# Patient Record
Sex: Male | Born: 1935 | Race: White | Hispanic: No | Smoking: Former smoker
Health system: Southern US, Community
[De-identification: ages and names within clinical notes are randomized; demographics above are authoritative.]

## PROBLEM LIST (undated history)

## (undated) DIAGNOSIS — I10 Essential (primary) hypertension: Secondary | ICD-10-CM

## (undated) DIAGNOSIS — IMO0001 Reserved for inherently not codable concepts without codable children: Secondary | ICD-10-CM

## (undated) DIAGNOSIS — Z87442 Personal history of urinary calculi: Secondary | ICD-10-CM

## (undated) DIAGNOSIS — J449 Chronic obstructive pulmonary disease, unspecified: Secondary | ICD-10-CM

## (undated) DIAGNOSIS — E119 Type 2 diabetes mellitus without complications: Secondary | ICD-10-CM

## (undated) DIAGNOSIS — R569 Unspecified convulsions: Secondary | ICD-10-CM

## (undated) DIAGNOSIS — M1990 Unspecified osteoarthritis, unspecified site: Secondary | ICD-10-CM

## (undated) HISTORY — PX: HERNIA REPAIR: SHX51

## (undated) HISTORY — PX: TONSILLECTOMY: SUR1361

## (undated) HISTORY — PX: MELANOMA EXCISION: SHX5266

## (undated) HISTORY — PX: APPENDECTOMY: SHX54

---

## 1999-06-10 ENCOUNTER — Encounter: Admission: RE | Admit: 1999-06-10 | Discharge: 1999-06-10 | Payer: Self-pay | Admitting: Internal Medicine

## 1999-06-17 ENCOUNTER — Encounter: Payer: Self-pay | Admitting: Internal Medicine

## 1999-06-17 ENCOUNTER — Encounter: Admission: RE | Admit: 1999-06-17 | Discharge: 1999-06-17 | Payer: Self-pay | Admitting: Family Medicine

## 2000-08-14 ENCOUNTER — Encounter: Payer: Self-pay | Admitting: Internal Medicine

## 2000-08-14 ENCOUNTER — Encounter: Admission: RE | Admit: 2000-08-14 | Discharge: 2000-08-14 | Payer: Self-pay | Admitting: Internal Medicine

## 2001-11-23 ENCOUNTER — Ambulatory Visit (HOSPITAL_COMMUNITY): Admission: RE | Admit: 2001-11-23 | Discharge: 2001-11-23 | Payer: Self-pay | Admitting: Internal Medicine

## 2001-12-21 ENCOUNTER — Emergency Department (HOSPITAL_COMMUNITY): Admission: EM | Admit: 2001-12-21 | Discharge: 2001-12-21 | Payer: Self-pay | Admitting: Emergency Medicine

## 2001-12-21 ENCOUNTER — Encounter: Payer: Self-pay | Admitting: Emergency Medicine

## 2001-12-28 ENCOUNTER — Emergency Department (HOSPITAL_COMMUNITY): Admission: EM | Admit: 2001-12-28 | Discharge: 2001-12-28 | Payer: Self-pay | Admitting: Emergency Medicine

## 2002-10-07 ENCOUNTER — Ambulatory Visit (HOSPITAL_COMMUNITY): Admission: RE | Admit: 2002-10-07 | Discharge: 2002-10-07 | Payer: Self-pay | Admitting: Gastroenterology

## 2002-10-07 ENCOUNTER — Encounter (INDEPENDENT_AMBULATORY_CARE_PROVIDER_SITE_OTHER): Payer: Self-pay | Admitting: Specialist

## 2006-04-08 ENCOUNTER — Encounter: Admission: RE | Admit: 2006-04-08 | Discharge: 2006-04-08 | Payer: Self-pay | Admitting: Internal Medicine

## 2009-07-14 ENCOUNTER — Emergency Department (HOSPITAL_COMMUNITY): Admission: EM | Admit: 2009-07-14 | Discharge: 2009-07-14 | Payer: Self-pay | Admitting: Emergency Medicine

## 2010-06-24 ENCOUNTER — Ambulatory Visit (HOSPITAL_COMMUNITY)
Admission: RE | Admit: 2010-06-24 | Discharge: 2010-06-24 | Payer: Self-pay | Source: Home / Self Care | Admitting: Urology

## 2010-09-23 ENCOUNTER — Inpatient Hospital Stay (HOSPITAL_COMMUNITY)
Admission: EM | Admit: 2010-09-23 | Discharge: 2010-09-30 | DRG: 596 | Disposition: A | Discharge status: Home / Self Care | Payer: MEDICARE | Attending: Internal Medicine | Admitting: Internal Medicine

## 2010-09-23 DIAGNOSIS — Z7982 Long term (current) use of aspirin: Secondary | ICD-10-CM

## 2010-09-23 DIAGNOSIS — Z79899 Other long term (current) drug therapy: Secondary | ICD-10-CM

## 2010-09-23 DIAGNOSIS — F3289 Other specified depressive episodes: Secondary | ICD-10-CM | POA: Diagnosis present

## 2010-09-23 DIAGNOSIS — B029 Zoster without complications: Principal | ICD-10-CM | POA: Diagnosis present

## 2010-09-23 DIAGNOSIS — F329 Major depressive disorder, single episode, unspecified: Secondary | ICD-10-CM | POA: Diagnosis present

## 2010-09-23 DIAGNOSIS — I10 Essential (primary) hypertension: Secondary | ICD-10-CM | POA: Diagnosis present

## 2010-09-23 DIAGNOSIS — Z860100 Personal history of colon polyps, unspecified: Secondary | ICD-10-CM

## 2010-09-23 DIAGNOSIS — K219 Gastro-esophageal reflux disease without esophagitis: Secondary | ICD-10-CM | POA: Diagnosis present

## 2010-09-23 DIAGNOSIS — J4489 Other specified chronic obstructive pulmonary disease: Secondary | ICD-10-CM | POA: Diagnosis present

## 2010-09-23 DIAGNOSIS — E119 Type 2 diabetes mellitus without complications: Secondary | ICD-10-CM | POA: Diagnosis present

## 2010-09-23 DIAGNOSIS — Z87891 Personal history of nicotine dependence: Secondary | ICD-10-CM

## 2010-09-23 DIAGNOSIS — E785 Hyperlipidemia, unspecified: Secondary | ICD-10-CM | POA: Diagnosis present

## 2010-09-23 DIAGNOSIS — J449 Chronic obstructive pulmonary disease, unspecified: Secondary | ICD-10-CM | POA: Diagnosis present

## 2010-09-23 DIAGNOSIS — K5900 Constipation, unspecified: Secondary | ICD-10-CM | POA: Diagnosis present

## 2010-09-23 LAB — CBC
MCHC: 32.6 g/dL (ref 30.0–36.0)
Platelets: 344 10*3/uL (ref 150–400)
RDW: 14.7 % (ref 11.5–15.5)
WBC: 8.6 10*3/uL (ref 4.0–10.5)

## 2010-09-23 LAB — URINALYSIS, ROUTINE W REFLEX MICROSCOPIC
Bilirubin Urine: NEGATIVE
Nitrite: NEGATIVE
Specific Gravity, Urine: 1.026 (ref 1.005–1.030)
Urine Glucose, Fasting: NEGATIVE mg/dL
pH: 5 (ref 5.0–8.0)

## 2010-09-23 LAB — DIFFERENTIAL
Basophils Absolute: 0 10*3/uL (ref 0.0–0.1)
Basophils Relative: 1 % (ref 0–1)
Eosinophils Absolute: 0.1 10*3/uL (ref 0.0–0.7)
Eosinophils Relative: 1 % (ref 0–5)
Lymphocytes Relative: 25 % (ref 12–46)
Monocytes Absolute: 0.6 10*3/uL (ref 0.1–1.0)

## 2010-09-23 LAB — CK TOTAL AND CKMB (NOT AT ARMC)
CK, MB: 1.4 ng/mL (ref 0.3–4.0)
Total CK: 46 U/L (ref 7–232)

## 2010-09-23 LAB — CARDIAC PANEL(CRET KIN+CKTOT+MB+TROPI): CK, MB: 1.5 ng/mL (ref 0.3–4.0)

## 2010-09-23 LAB — COMPREHENSIVE METABOLIC PANEL
ALT: 13 U/L (ref 0–53)
AST: 15 U/L (ref 0–37)
Albumin: 3.8 g/dL (ref 3.5–5.2)
Alkaline Phosphatase: 45 U/L (ref 39–117)
Calcium: 9.4 mg/dL (ref 8.4–10.5)
GFR calc Af Amer: >60 mL/min (ref >60)
Glucose, Bld: 123 mg/dL — ABNORMAL HIGH (ref 70–99)
Potassium: 4.1 mEq/L (ref 3.5–5.1)
Sodium: 138 mEq/L (ref 135–145)
Total Protein: 6.6 g/dL (ref 6.0–8.3)

## 2010-09-23 LAB — HEMOGLOBIN A1C: Mean Plasma Glucose: 131 mg/dL — ABNORMAL HIGH (ref <117)

## 2010-09-23 LAB — TROPONIN I: Troponin I: <0.01 ng/mL (ref 0.00–0.06)

## 2010-09-23 LAB — MRSA PCR SCREENING: MRSA by PCR: NEGATIVE

## 2010-09-24 LAB — LIPID PANEL
HDL: 32 mg/dL — ABNORMAL LOW (ref >39)
Total CHOL/HDL Ratio: 3.6 RATIO
Triglycerides: 186 mg/dL — ABNORMAL HIGH (ref <150)
VLDL: 37 mg/dL (ref 0–40)

## 2010-09-24 LAB — GLUCOSE, CAPILLARY
Glucose-Capillary: 167 mg/dL — ABNORMAL HIGH (ref 70–99)
Glucose-Capillary: 163 mg/dL — ABNORMAL HIGH (ref 70–99)

## 2010-09-24 LAB — CBC
HCT: 34.9 % — ABNORMAL LOW (ref 39.0–52.0)
MCH: 27.4 pg (ref 26.0–34.0)
MCV: 87 fL (ref 78.0–100.0)
RBC: 4.01 MIL/uL — ABNORMAL LOW (ref 4.22–5.81)
WBC: 9.9 10*3/uL (ref 4.0–10.5)

## 2010-09-24 LAB — BASIC METABOLIC PANEL
CO2: 26 mEq/L (ref 19–32)
Chloride: 105 mEq/L (ref 96–112)
GFR calc Af Amer: >60 mL/min (ref >60)
Potassium: 4 mEq/L (ref 3.5–5.1)

## 2010-09-24 LAB — CARDIAC PANEL(CRET KIN+CKTOT+MB+TROPI): Troponin I: 0.02 ng/mL (ref 0.00–0.06)

## 2010-09-25 LAB — BASIC METABOLIC PANEL
BUN: 16 mg/dL (ref 6–23)
Chloride: 106 mEq/L (ref 96–112)
Creatinine, Ser: 1.06 mg/dL (ref 0.4–1.5)

## 2010-09-25 LAB — GLUCOSE, CAPILLARY: Glucose-Capillary: 144 mg/dL — ABNORMAL HIGH (ref 70–99)

## 2010-09-25 LAB — CBC
MCH: 27.5 pg (ref 26.0–34.0)
MCHC: 31.6 g/dL (ref 30.0–36.0)
MCV: 86.9 fL (ref 78.0–100.0)
Platelets: 341 10*3/uL (ref 150–400)
RBC: 4.44 MIL/uL (ref 4.22–5.81)
RDW: 15.4 % (ref 11.5–15.5)

## 2010-09-26 LAB — GLUCOSE, CAPILLARY
Glucose-Capillary: 131 mg/dL — ABNORMAL HIGH (ref 70–99)
Glucose-Capillary: 119 mg/dL — ABNORMAL HIGH (ref 70–99)

## 2010-09-27 LAB — GLUCOSE, CAPILLARY
Glucose-Capillary: 119 mg/dL — ABNORMAL HIGH (ref 70–99)
Glucose-Capillary: 163 mg/dL — ABNORMAL HIGH (ref 70–99)

## 2010-09-28 LAB — GLUCOSE, CAPILLARY
Glucose-Capillary: 83 mg/dL (ref 70–99)
Glucose-Capillary: 217 mg/dL — ABNORMAL HIGH (ref 70–99)
Glucose-Capillary: 122 mg/dL — ABNORMAL HIGH (ref 70–99)

## 2010-09-30 LAB — GLUCOSE, CAPILLARY: Glucose-Capillary: 99 mg/dL (ref 70–99)

## 2010-10-02 NOTE — Discharge Summary (Signed)
NAMEVISHWA, Alejandro Hall NO.:  [id number]  MEDICAL RECORD NO.:  [id number]           PATIENT TYPE:  I  LOCATION:  4506                         FACILITY:  [facility]  PHYSICIAN:  Thora Lance, M.D.  DATE OF BIRTH:  November 13, 1928  DATE OF ADMISSION:  09/23/2010 DATE OF DISCHARGE:  09/30/2010                              DISCHARGE SUMMARY   REASON FOR ADMISSION:  A 77 year old white male who presented with chest pain and left flank pain.  The pain started 2 days prior to admission. The pain had been severe.  SIGNIFICANT FINDINGS:  VITAL SIGNS:  Blood pressure 132/82, heart rate 69, respirations 20, oxygen saturation 98%. LUNGS:  Decreased breath sounds bilaterally. HEART:  Regular rate and rhythm. ABDOMEN:  Soft, nontender, nondistended. EXTREMITIES:  No edema.  LABORATORY:  CBC; WBC was 8.6, hemoglobin 13.1, hematocrit 40.2, platelets 244,000.  Sodium 130, potassium 4.1, BUN 22, creatinine 1.22. Troponin less 0.01.  Chest x-ray, hyperinflated lungs.  CT scan of the abdomen and pelvis showed nonobstructive tiny bilateral renal calculi, no evidence of urinary obstruction, moderate BPH, and small left adrenal adenoma.  EKG showed some T-wave inversions and ST depression in V5 and V6.  HOSPITAL COURSE:  The patient was admitted for chest pain.  Cardiology was initially consulted.  They felt his pain was not likely to be cardiac as a D-dimer was done and was normal which essentially ruled out PE.  On September 25, 2010, a small area of erythema developed on his left side.  By September 26, 2010, a typical maculovesicular rash of herpes zoster was evident.  The patient was treated with valacyclovir, steroids, Lidoderm patch, narcotics, and Lyrica for his severe pain.  By discharge, his pain was considerably better.  He was treated with laxatives for constipation.  Despite steroids, his diabetes remained under reasonable control on an insulin sliding scale.  At  discharge, oxygen saturation was 94% on room air.  The patient was discharged in good condition.  DISCHARGE DIAGNOSES: 1. Herpes zoster. 2. Chronic obstructive pulmonary disease. 3. Type 2 diabetes. 4. Hypertension. 5. Hyperlipidemia. 6. Gastroesophageal reflux disease. 7. Depression. 8. Kidney stones.  PROCEDURES:  Echocardiogram.  DISCHARGE MEDICATIONS: 1. Lipitor 20 mg one p.o. daily. 2. Aspirin 81 mg one p.o. daily. 3. Amlodipine 10 mg one p.o. daily. 4. Sertraline 50 mg one p.o. daily. 5. Albuterol HFA 108 mcg 2 inhalations q.4 p.r.n. 6. Symbicort 150/4.5 mcg 2 puffs twice a day. 7. Metformin 1000 mg b.i.d. 8. Lorazepam 1 mg at bedtime as needed. 9. Fish oil 1000 mg 1 tablet once a day. 10.Multivitamin 1 p.o. at bedtime. 11.Flonase 2 sprays each nostril daily as needed. 12.Percocet 10/325 one p.o. q.6 p.r.n. for pain. 13.Lidoderm patch, apply to painful area on side daily for 12 hours as     needed for pain. 14.Neurontin 300 mg one p.o. t.i.d. 15.Valacyclovir 1000 mg p.o. t.i.d. for 2 days and then discontinue. 16.Prednisone 20 mg tablets, one a day for 1 day, one-half a day for 2     days, and then discontinue.  DISPOSITION:  Discharged to home.  FOLLOW  UP:  Ten days, Dr. Valentina Lucks.  ACTIVITY:  As tolerated.  CODE STATUS:  Full code.          ______________________________ Thora Lance, M.D.     JJG/MEDQ  D:  09/30/2010  T:  09/30/2010  Job:  [id number]  Electronically Signed by Kirby Funk M.D. on 10/02/2010 05:52:54 PM

## 2010-10-09 NOTE — H&P (Signed)
NAMEJADIEN, LEHIGH                  ACCOUNT NO.:  [id number]  MEDICAL RECORD NO.:  [id number]          PATIENT TYPE:  INP  LOCATION:  1831                         FACILITY:  [facility]  PHYSICIAN:  Thad Ranger, MD       DATE OF BIRTH:  1929/02/25  DATE OF ADMISSION:  09/23/2010 DATE OF DISCHARGE:                             HISTORY & PHYSICAL   PRIMARY CARE PHYSICIAN:  Thora Lance, M.D.  UROLOGIST:  Courtney Paris, M.D.  CHIEF COMPLAINT:  Chest pain and left flank pain for the last 2 days.  HISTORY OF PRESENT ILLNESS:  Mr. Bifulco is a 77 year old male with history of hypertension, hyperlipidemia, diabetes mellitus, COPD, a history of kidney stones presented to the [facility] with chest pain as well as left flank pain.  History was provided by the patient who stated that his chest pain started on Saturday 2 days ago. He stated that at the time his chest pain started, he was looking at a car a dealership to buy.  He started having chest pain, tightness, and squeezing which initially has been 10/10 in intensity at the time of arrival to the emergency room.  After the pain medication, his chest pain has improved to 6/10.  He has chronic shortness of breath secondary to severe COPD and states that ambulating about 20-25 feet may make his chest pain as well as the shortness of breath worse.  He also endorses radiation of the left-sided chest pain to the left shoulder as well as middle two fingers of the left hand with numbness and tingling.  He states that he also has some left flank pain which also started at the same time.  He denies any dysuria or hematuria.  He denies any fevers or chills, any productive cough.  He has chronic cough from his COPD.  He denies being on home oxygen.  He states that he does have a history of kidney stones and had lithotripsy done in October 2011.  Hospitalist service was called for the chest pain rule out.  PAST MEDICAL  HISTORY: 1. COPD. 2. Hypertension. 3. Diabetes mellitus. 4. Hyperlipidemia. 5. History of kidney stones with lithotripsy in October 2011.  SOCIAL HISTORY:  The patient was a former smoker who smoked one pack per day for 25 years, quit 30 years ago.  Does not drink or use any alcohol. He lives at home and ambulates without any assistance.  ALLERGIES:  No known drug allergies.  MEDICATIONS:  The patient is not sure of the medication doses hence will await for the pharmacy for the med reconciliation.  He is on aspirin 325 mg p.o. daily.  Other medications include amlodipine, hydrochlorothiazide, Lipitor, metformin, Symbicort, Zoloft, Ativan, timolol, ProAir, Spiriva.  PHYSICAL EXAMINATION:  VITAL SIGNS:  Blood pressure 132/82, pulse 69, respiratory rate 20, O2 sats 98%. GENERAL:  The patient is alert, awake, and oriented x3, not in acute distress. HEENT:  Anicteric sclerae, pink conjunctiva.  Pupils are equal reactive to light and accommodation.  EOMI. NECK:  Supple.  No lymphadenopathy. CVS:  Regular rate and rhythm. CHEST:  Distant  lung sounds with scattered rhonchi. ABDOMEN:  Soft, nontender, nondistended.  Normal bowel sounds.  Mild left CVAP. NEUROLOGIC:  Alert and oriented x3, not in acute distress.  No focal neurological deficits noted. EXTREMITIES:  No cyanosis, clubbing or edema noted in upper or lower extremities.  LAB DIAGNOSTIC DATA:  CBC, white count 8.6, hemoglobin 13.1, hematocrit 40.2, platelets 344.  CMP, sodium 138, potassium 4.1, BUN 22, creatinine 1.22.  Troponin less than 0.01.  UA negative for any UTI.  RADIOLOGICAL DATA:  Chest x-ray on January 30, lungs appear hyperinflated consistent with obstructive pulmonary disease.  No acute superimposed process.  CT abdomen and pelvis without contrast showed one nonobstructing tiny bilateral renal calculi.  No evidence of urinary tract obstruction on either side to the descending and sigmoid colon, diverticulosis  without evidence of acute diverticulitis.  Moderate prostate gland enlargement particularly the median lobe.  Small left adrenal adenoma.  No acute abnormalities involving the abdomen or pelvis apart from possible constipation.  EKG rate 86 with T-wave inversion and ST depression in V5 and V6 as well as V4.  IMPRESSION AND PLAN:  Mr. Kroh is a 77 year old male with history of hypertension, hyperlipidemia, diabetes mellitus presents with chest pain. 1. Left-sided chest pain, possible angina equivalent given the     patient's high risk factors of hypertension, hyperlipidemia, and     diabetes mellitus.  We will admit to the step-down unit for     nitroglycerin drip.  He does have some EKG changes on the EKG;     however, troponin first set has been negative so far.  We will     continue the patient on aspirin, beta-blocker, nitroglycerin and     obtain fasting lipid panel and serial cardiac enzymes.  If the     cardiac enzymes trend to be positive then the patient will be     started on heparin drip.  I have discussed in detail with [facility]     Cardiology Dr. Lance Muss who will be evaluating the patient     as well for further recommendations.  For now, I have also order 2-     D echocardiogram as the patient has a significant history of COPD     to assess for any pulmonary hypertension or cor pulmonale.     Inpatient stress test versus further cardiac intervention will be     determined by Dr. Eldridge Dace. 2. Left flank pain, question if it has been anginal equivalent from     the chest pain.  The patient had a CT abdomen and pelvis done in     the emergency room which did not show any acute obstructing     nephrolithiasis or any acute abdominal abnormalities except     constipation.  We will start the patient on bowel regimen as well. 3. Chronic obstructive pulmonary disease.  The patient would ideally     need PFTs; however, given the description of his shortness of     breath  with minimal exertion, we will continue Symbicort, Spiriva,     and albuterol nebs.  I will also wait echocardiogram to assess for     any pulmonary hypertension.  He will likely benefit from     Pulmonology consult and home O2 evaluation at the time of     discharge. 4. Diabetes mellitus.  Place the patient on sliding scale insulin     while inpatient.  Obtain HbA1c. 5. Hypertension and hyperlipidemia.  Continue antihypertensives and  obtain fasting lipid panel. 6. Prophylaxis.  Lovenox for deep venous thrombosis prophylaxis. 7. Constipation, placed on a bowel regimen. 8. Dr. Kirby Funk will be following this patient as of September 24, 2010.     Thad Ranger, MD     RR/MEDQ  D:  09/23/2010  T:  09/23/2010  Job:  [id number]  cc:   Thora Lance, M.D. Corky Crafts, MD  Electronically Signed by Andres Labrum Dartagnan Beavers  on 10/09/2010 03:10:42 PM

## 2010-10-09 NOTE — Consult Note (Signed)
NAMEENDER, RORKE NO.:  [id number]  MEDICAL RECORD NO.:  [id number]           PATIENT TYPE:  I  LOCATION:  4506                         FACILITY:  [facility]  PHYSICIAN:  Corky Crafts, MDDATE OF BIRTH:  1929/03/20  DATE OF CONSULTATION:  09/23/2010 DATE OF DISCHARGE:                                CONSULTATION   REFERRING PHYSICIAN:  Thad Ranger, MD  PRIMARY CARE PHYSICIAN:  Thora Lance, MD  REASON FOR CONSULTATION:  Chest discomfort.  HISTORY OF PRESENT ILLNESS:  The patient is a 77 year old man, who has had longstanding diabetes, who was admitted for left-sided chest pain. Pain started a couple of days ago and is really in the lower part of his left chest and in his left side of his abdomen and radiates to his left flank.  At times, there is a pain across the entire upper area of his abdomen.  There is nothing in the substernal area.  There is no pain in his upper chest.  The pain is best described as being in his upper abdomen just under the inferior border of his left side of his rib cage. It is essentially constant pain.  It is worse when he takes a deep breath in or if he moves in a certain way.  He has not noticed any relation to walking.  There has been no associated nausea, vomiting, diarrhea, or constipation.  There has been no syncope.  His appetite has been normal.  He has had kidney stones in the past, but this feels somewhat different  PAST MEDICAL HISTORY: 1. COPD. 2. Type 2 diabetes. 3. Hypertension. 4. High cholesterol. 5. GERD. 6. Depression. 7. History of stricture of the esophagus. 8. Kidney stones. 9. Colonic polyps.  ALLERGIES:  No known drug allergies.  SOCIAL HISTORY:  He smoked in the past.  He does not drink alcohol.  He does on get much exercise.  He is retired.  PAST SURGICAL HISTORY: 1. Appendectomy. 2. Tonsillectomy. 3. Bilateral knee surgery. 4. Skin surgery. 5. Umbilical hernia repair. 6. Eye  surgery.  HOME MEDICATIONS:  Symbicort, albuterol, sertraline, lorazepam, amlodipine 10 mg daily, metformin, aspirin 81 mg daily, Tylenol, Flonase, multivitamin, fish oil, Lipitor 20 mg daily.  REVIEW OF SYSTEMS:  Significant for the abdominal and flank pain as described above.  No chest pain.  No leg swelling.  No focal weakness. No bleeding problems.  Unless mentioned above, all other systems negative.  PHYSICAL EXAMINATION:  VITAL SIGNS:  Blood pressure 144/82, heart rate 80. GENERAL:  He is awake, alert, in no apparent distress. HEENT:  Head:  Normocephalic, atraumatic.  Eyes:  Extraocular movements intact. NECK:  No JVD. CARDIOVASCULAR:  Regular rate and rhythm, S1 and S2. LUNGS:  Clear to auscultation bilaterally. ABDOMEN:  Mildly obese, tender to palpation on the left upper quadrant. No rebound.  No guarding. EXTREMITIES:  No edema. NEUROLOGIC:  No focal weakness. SKIN:  No rash. BACK:  No kyphosis.  LABORATORY WORK:  Normal troponin at less than 0.01, creatinine 1.21, potassium 4.1.  Hemoglobin 13.1, white count 8.6.Chest x-ray shows changes consistent with  COPD, no pneumonia or edema. EKG shows normal sinus rhythm, minimal ST depression in the lateral leads, consistent with the same pattern that he had on prior ECG.  ASSESSMENT AND PLAN:  This is a 77 year old with hypertension and atypical chest pain.  Plan: 1. CARDIAC:  We will watch on telemetry, rule out for MI.  He is     currently on a nitroglycerin drip, this does not seem to be helping     much.  My sense is that his pain is not cardiac in nature given the     location.  He did have an abdominal CT which did not show any     significant cause of the pain.  Consider a stress test tomorrow     although again I think this is unlikely to be cardiac.  Certainly     if his cardiac markers became positive, we will have to consider     cardiac catheterization.  He did have atherosclerosis noted in his     abdominal  aorta on the CT scan. 2. Hypertension.  Currently fairly well controlled.  Home meds can be     restarted 3. Of note, he had a negative cardiac cath in 1993 and a negative     Cardiolite in 2003.  No significant cardiac workup since that time. 4. I would not use heparin in this patient at this time unless his     cardiac markers became positive.  We will follow along.     Corky Crafts, MD     JSV/MEDQ  D:  09/25/2010  T:  09/26/2010  Job:  [id number]  Electronically Signed by Lance Muss MD on 10/09/2010 09:48:00 AM

## 2010-11-06 LAB — BASIC METABOLIC PANEL
CO2: 29 mEq/L (ref 19–32)
Chloride: 105 mEq/L (ref 96–112)
GFR calc Af Amer: >60 mL/min (ref >60)
Potassium: 4.1 mEq/L (ref 3.5–5.1)
Sodium: 141 mEq/L (ref 135–145)

## 2010-11-06 LAB — CBC
HCT: 42.9 % (ref 39.0–52.0)
Hemoglobin: 14.2 g/dL (ref 13.0–17.0)
RBC: 4.98 MIL/uL (ref 4.22–5.81)

## 2010-12-07 ENCOUNTER — Emergency Department (HOSPITAL_COMMUNITY): Payer: Medicare Other

## 2010-12-07 ENCOUNTER — Inpatient Hospital Stay (HOSPITAL_COMMUNITY)
Admission: EM | Admit: 2010-12-07 | Discharge: 2010-12-11 | DRG: 316 | Disposition: A | Discharge status: Home / Self Care | Payer: Medicare Other | Attending: Internal Medicine | Admitting: Internal Medicine

## 2010-12-07 DIAGNOSIS — Z9981 Dependence on supplemental oxygen: Secondary | ICD-10-CM

## 2010-12-07 DIAGNOSIS — F3289 Other specified depressive episodes: Secondary | ICD-10-CM | POA: Diagnosis present

## 2010-12-07 DIAGNOSIS — I319 Disease of pericardium, unspecified: Principal | ICD-10-CM | POA: Diagnosis present

## 2010-12-07 DIAGNOSIS — R079 Chest pain, unspecified: Secondary | ICD-10-CM

## 2010-12-07 DIAGNOSIS — E875 Hyperkalemia: Secondary | ICD-10-CM | POA: Diagnosis present

## 2010-12-07 DIAGNOSIS — K219 Gastro-esophageal reflux disease without esophagitis: Secondary | ICD-10-CM | POA: Diagnosis present

## 2010-12-07 DIAGNOSIS — J4489 Other specified chronic obstructive pulmonary disease: Secondary | ICD-10-CM | POA: Diagnosis present

## 2010-12-07 DIAGNOSIS — E119 Type 2 diabetes mellitus without complications: Secondary | ICD-10-CM | POA: Diagnosis present

## 2010-12-07 DIAGNOSIS — I10 Essential (primary) hypertension: Secondary | ICD-10-CM | POA: Diagnosis present

## 2010-12-07 DIAGNOSIS — J449 Chronic obstructive pulmonary disease, unspecified: Secondary | ICD-10-CM | POA: Diagnosis present

## 2010-12-07 DIAGNOSIS — IMO0002 Reserved for concepts with insufficient information to code with codable children: Secondary | ICD-10-CM | POA: Diagnosis present

## 2010-12-07 DIAGNOSIS — F329 Major depressive disorder, single episode, unspecified: Secondary | ICD-10-CM | POA: Diagnosis present

## 2010-12-07 LAB — PROTIME-INR
INR: 1.07 (ref 0.00–1.49)
Prothrombin Time: 14.1 seconds (ref 11.6–15.2)

## 2010-12-07 LAB — DIFFERENTIAL
Basophils Absolute: 0 K/uL (ref 0.0–0.1)
Basophils Relative: 0 % (ref 0–1)
Eosinophils Absolute: 0 K/uL (ref 0.0–0.7)
Eosinophils Relative: 0 % (ref 0–5)
Lymphocytes Relative: 12 % (ref 12–46)
Lymphs Abs: 1.7 10*3/uL (ref 0.7–4.0)
Monocytes Absolute: 1.8 K/uL — ABNORMAL HIGH (ref 0.1–1.0)
Monocytes Relative: 13 % — ABNORMAL HIGH (ref 3–12)
Neutro Abs: 11 10*3/uL — ABNORMAL HIGH (ref 1.7–7.7)
Neutrophils Relative %: 75 % (ref 43–77)

## 2010-12-07 LAB — CK TOTAL AND CKMB (NOT AT ARMC)
CK, MB: 1.3 ng/mL (ref 0.3–4.0)
Relative Index: INVALID (ref 0.0–2.5)
Relative Index: INVALID (ref 0.0–2.5)
Total CK: 72 U/L (ref 7–232)
Total CK: 45 U/L (ref 7–232)

## 2010-12-07 LAB — URINALYSIS, ROUTINE W REFLEX MICROSCOPIC
Bilirubin Urine: NEGATIVE
Glucose, UA: NEGATIVE mg/dL
Hgb urine dipstick: NEGATIVE
Ketones, ur: NEGATIVE mg/dL
Nitrite: NEGATIVE
Protein, ur: NEGATIVE mg/dL
Specific Gravity, Urine: 1.016 (ref 1.005–1.030)
Urobilinogen, UA: 0.2 mg/dL (ref 0.0–1.0)
pH: 5.5 (ref 5.0–8.0)

## 2010-12-07 LAB — BASIC METABOLIC PANEL
CO2: 28 mEq/L (ref 19–32)
CO2: 27 mEq/L (ref 19–32)
Calcium: 9.8 mg/dL (ref 8.4–10.5)
Chloride: 103 mEq/L (ref 96–112)
Creatinine, Ser: 1.2 mg/dL (ref 0.4–1.5)
Creatinine, Ser: 1.32 mg/dL (ref 0.4–1.5)
GFR calc Af Amer: >60 mL/min (ref >60)
GFR calc Af Amer: >60 mL/min (ref >60)
GFR calc non Af Amer: 58 mL/min — ABNORMAL LOW (ref >60)

## 2010-12-07 LAB — GLUCOSE, CAPILLARY
Glucose-Capillary: 151 mg/dL — ABNORMAL HIGH (ref 70–99)
Glucose-Capillary: 87 mg/dL (ref 70–99)

## 2010-12-07 LAB — BASIC METABOLIC PANEL WITH GFR
BUN: 16 mg/dL (ref 6–23)
Chloride: 100 meq/L (ref 96–112)
Glucose, Bld: 163 mg/dL — ABNORMAL HIGH (ref 70–99)
Potassium: 5.3 meq/L — ABNORMAL HIGH (ref 3.5–5.1)
Sodium: 136 meq/L (ref 135–145)

## 2010-12-07 LAB — CBC
HCT: 40.2 % (ref 39.0–52.0)
Hemoglobin: 13.3 g/dL (ref 13.0–17.0)
MCH: 28.2 pg (ref 26.0–34.0)
MCHC: 33.1 g/dL (ref 30.0–36.0)
MCV: 85.4 fL (ref 78.0–100.0)
Platelets: 372 K/uL (ref 150–400)
RBC: 4.71 MIL/uL (ref 4.22–5.81)
RDW: 15.2 % (ref 11.5–15.5)
WBC: 14.6 10*3/uL — ABNORMAL HIGH (ref 4.0–10.5)

## 2010-12-07 LAB — TROPONIN I
Troponin I: 0.02 ng/mL (ref 0.00–0.06)
Troponin I: 0.04 ng/mL (ref 0.00–0.06)

## 2010-12-07 LAB — D-DIMER, QUANTITATIVE: D-Dimer, Quant: 0.39 ug/mL-FEU (ref 0.00–0.48)

## 2010-12-07 LAB — APTT: aPTT: 34 seconds (ref 24–37)

## 2010-12-07 LAB — CARDIAC PANEL(CRET KIN+CKTOT+MB+TROPI): Troponin I: 0.03 ng/mL (ref 0.00–0.06)

## 2010-12-07 LAB — TSH: TSH: 1.9 u[IU]/mL (ref 0.350–4.500)

## 2010-12-08 DIAGNOSIS — I10 Essential (primary) hypertension: Secondary | ICD-10-CM

## 2010-12-08 DIAGNOSIS — E119 Type 2 diabetes mellitus without complications: Secondary | ICD-10-CM

## 2010-12-08 DIAGNOSIS — I309 Acute pericarditis, unspecified: Secondary | ICD-10-CM

## 2010-12-08 DIAGNOSIS — J449 Chronic obstructive pulmonary disease, unspecified: Secondary | ICD-10-CM

## 2010-12-08 LAB — CARDIAC PANEL(CRET KIN+CKTOT+MB+TROPI)
CK, MB: 1.5 ng/mL (ref 0.3–4.0)
Relative Index: INVALID (ref 0.0–2.5)
Troponin I: 0.03 ng/mL (ref 0.00–0.06)

## 2010-12-08 LAB — GLUCOSE, CAPILLARY
Glucose-Capillary: 99 mg/dL (ref 70–99)
Glucose-Capillary: 130 mg/dL — ABNORMAL HIGH (ref 70–99)

## 2010-12-08 LAB — CBC
Hemoglobin: 11.4 g/dL — ABNORMAL LOW (ref 13.0–17.0)
RBC: 4.15 MIL/uL — ABNORMAL LOW (ref 4.22–5.81)
WBC: 9.7 10*3/uL (ref 4.0–10.5)

## 2010-12-08 LAB — LIPID PANEL
Cholesterol: 101 mg/dL (ref 0–200)
Total CHOL/HDL Ratio: 2.5 RATIO

## 2010-12-08 LAB — BASIC METABOLIC PANEL
Chloride: 103 mEq/L (ref 96–112)
GFR calc Af Amer: 59 mL/min — ABNORMAL LOW (ref >60)
Potassium: 4.1 mEq/L (ref 3.5–5.1)

## 2010-12-08 LAB — SEDIMENTATION RATE: Sed Rate: 42 mm/hr — ABNORMAL HIGH (ref 0–16)

## 2010-12-09 LAB — GLUCOSE, CAPILLARY
Glucose-Capillary: 114 mg/dL — ABNORMAL HIGH (ref 70–99)
Glucose-Capillary: 134 mg/dL — ABNORMAL HIGH (ref 70–99)
Glucose-Capillary: 99 mg/dL (ref 70–99)

## 2010-12-10 LAB — BASIC METABOLIC PANEL
Calcium: 8.2 mg/dL — ABNORMAL LOW (ref 8.4–10.5)
GFR calc Af Amer: >60 mL/min (ref >60)
GFR calc non Af Amer: >60 mL/min (ref >60)
Sodium: 143 mEq/L (ref 135–145)

## 2010-12-10 LAB — CBC
Hemoglobin: 10.5 g/dL — ABNORMAL LOW (ref 13.0–17.0)
MCHC: 32 g/dL (ref 30.0–36.0)
Platelets: 330 10*3/uL (ref 150–400)
RDW: 15.7 % — ABNORMAL HIGH (ref 11.5–15.5)

## 2010-12-10 LAB — GLUCOSE, CAPILLARY
Glucose-Capillary: 99 mg/dL (ref 70–99)
Glucose-Capillary: 96 mg/dL (ref 70–99)
Glucose-Capillary: 102 mg/dL — ABNORMAL HIGH (ref 70–99)

## 2010-12-11 LAB — CBC
MCH: 27.4 pg (ref 26.0–34.0)
MCV: 85.1 fL (ref 78.0–100.0)
Platelets: 345 10*3/uL (ref 150–400)
RDW: 15.4 % (ref 11.5–15.5)

## 2010-12-11 LAB — BASIC METABOLIC PANEL
BUN: 22 mg/dL (ref 6–23)
Creatinine, Ser: 0.97 mg/dL (ref 0.4–1.5)
GFR calc non Af Amer: >60 mL/min (ref >60)
Glucose, Bld: 104 mg/dL — ABNORMAL HIGH (ref 70–99)

## 2010-12-11 LAB — GLUCOSE, CAPILLARY: Glucose-Capillary: 105 mg/dL — ABNORMAL HIGH (ref 70–99)

## 2010-12-12 NOTE — Consult Note (Signed)
NAMESHANDELL, JALLOW                  ACCOUNT NO.:  [id number]  MEDICAL RECORD NO.:  [id number]           PATIENT TYPE:  I  LOCATION:  3708                         FACILITY:  [facility]  PHYSICIAN:  Peter C. Eden Emms, MD, FACCDATE OF BIRTH:  [birth year]  DATE OF CONSULTATION:  12/07/2010 DATE OF DISCHARGE:                                CONSULTATION   A 77 year old patient we are asked to see by the teaching service for recurrent chest pain.  The patient had just been evaluated by Dr. Catalina Gravel on February 2.  He was hospitalized for what was thought to be atypical chest pain.  He ruled out for myocardial infarction.  His EKG had no acute changes.  He had a 2-D echocardiogram done on January 31 which showed mild concentric left ventricular hypertrophy with no pericardial effusion and no significant valvular heart disease.  He has had recurrent chest pain since Thursday.  During his last hospitalization, he had a skin eruption which turned out to be shingles. This has been somewhat painful.  Since Thursday, he has had pain in the left side of his chest and shoulder area.  It is positional.  There is also a pleuritic component.  His EKG in the emergency room is consistent with pericarditis with PR depression in II, III, and F and slight ST elevation in the inferolateral leads.  His cardiac enzymes are negative and his clinical history is much more consistent with a non acute coronary syndrome and likely pericardial inflammation.  He has chronic dyspnea and is on home O2 even though he quit smoking more than 30 years ago.  He denies nausea or vomiting.  He denies recent trauma.  He denies any change in his chronic dyspnea.  He has had significant pain from his shingles which continues.  He is being admitted by the teaching service for pain control.  His 10-point review of systems otherwise negative.  PAST MEDICAL HISTORY:  Remarkable for: 1. Hypertension. 2. Diabetes. 3.  Hyperlipidemia. 4. Kidney stones. 5. COPD. 6. Recent hospitalization for chest pain with shingles.  The patient is a former smoker.  He is widowed.  He lives with his son. He does not drink.  He ambulates with a cane and is on home O2 every night and most days.  He has no known allergies.  MEDICATIONS: 1. Amlodipine 10 mg a day. 2. Symbicort. 3. Tylox. 4. Lorazepam. 5. Lipitor 20 mg a day. 6. Hydrochlorothiazide 12.5 a day. 7. Cymbalta 60 mg a day. 8. Sertraline 50 mg a day. 9. Metformin 1 g b.i.d. 10.Lyrica 150 b.i.d.  He has no history of bleeding ulcers or gastritis.  No history of lower GI bleed or melena.  FAMILY HISTORY:  Negative for premature coronary artery disease and otherwise noncontributory.  PHYSICAL EXAMINATION:  GENERAL:  Remarkable for chronically ill- appearing male with oxygen on. VITAL SIGNS:  His vital signs are stable.  Blood pressure is 130/80, pulse is 80 and regular, respiratory rate 14, and afebrile. HEENT:  Unremarkable. NECK:  There is no bruit.  No JVP elevation.  No lymphadenopathy. LUNGS:  Inspiratory rhonchi  which appear chronic.  He has reasonable diaphragmatic motion. CARDIAC:  There is an S1 and S2.  I cannot hear a rub, but it is somewhat noisy in the emergency room.  There is no obvious murmur. CHEST:  He has shingles over the chest wall. ABDOMEN:  Benign.  Bowel sounds positive.  No AAA.  No tenderness.  No bruit.  No hepatosplenomegaly, hepatojugular reflux, or tenderness. EXTREMITIES:  Distal pulses are intact with trace edema. NEUROLOGIC:  Nonfocal. SKIN:  Warm and dry. MUSCULOSKELETAL:  No muscular weakness.  His chest x-ray shows emphysema with no active disease.  His point-of- care markers are negative for acute ischemic syndrome.  Potassium is 5.3, BUN is 16, creatinine is 1.2.  D-dimer is negative.  White count is elevated at 14.6 and hematocrit is 40.2.  IMPRESSION: 1. Chest pain with positional and pleuritic  component.  ECG suggesting     pericardial involvement.  No evidence of acute coronary syndrome.     We will treat the patient with b.i.d. aspirin, q.8 Naprosyn, and     b.i.d. colchicine.  We would avoid steroids in the setting of     recent shingles.  We will do a followup echocardiogram to make sure     he has not developed a significant pericardial effusion.  The     patient does have an elevated white count and I will leave it up to     the primary service to rule out any other infectious source of his     symptoms.  I suspect that the pain is also being contributed to by     his shingles. 2. Diabetes.  Continue metformin.  Hemoglobin A1c per primary service. 3. Hypertension, currently well controlled.  Continue Norvasc.  We will add a sedimentation rate and continue to get cardiac enzymes on the patient, although I believe his EKG and clinical symptoms are consistent with pericardial inflammation.     Noralyn Pick. Eden Emms, MD, [facility]     PCN/MEDQ  D:  12/07/2010  T:  12/08/2010  Job:  [id number]  cc:   Thora Lance, M.D. Corky Crafts, MD  Electronically Signed by Charlton Haws MD [facility] on 12/12/2010 11:32:28 AM

## 2010-12-16 NOTE — H&P (Signed)
NAMESOPHIA, Hall NO.:  [id number]  MEDICAL RECORD NO.:  [id number]           PATIENT TYPE:  E  LOCATION:  [facility]                         FACILITY:  [facility]  PHYSICIAN:  Zannie Cove, MD     DATE OF BIRTH:  1929/04/18  DATE OF ADMISSION:  12/07/2010 DATE OF DISCHARGE:                             HISTORY & PHYSICAL   PRIMARY CARE PHYSICIAN:  Thora Lance, MD.  CHIEF COMPLAINT:  Chest and upper back pain.  HISTORY OF PRESENT ILLNESS:  Mr. Alejandro Hall was a 77 year old pleasant Caucasian gentleman with history of COPD, diabetes, hypertension, presents to the ER with the above-mentioned complaints.  The patient reports that since Thursday, i.e., since the last two days, he has been having severe dull ache across his chest as well as between his shoulder blades.  It is described as a severe ache occasionally radiating down the left arm.  Not associated with nausea or diaphoresis.  Associated with some shortness of breath.  In addition, the chest pain is pleuritic in nature and increases severely with deep inspiration.  Upon evaluation in the ER, he was found to have PR depression on EKG as well as mild diffuse ST-elevation diffusely and triad hospitalist have requested to admit him for the same.  Of note, the patient does report history of cough, congestion, and URI-like symptoms four days ago, which has since improved.  PAST MEDICAL HISTORY:  Significant for, 1. COPD, on home O2. 2. History of herpes zoster. 3. Type 2 diabetes. 4. Hypertension. 5. Dyslipidemia. 6. GERD. 7. Depression. 8. History of kidney stones. 9. History of negative cardiac cath in 1993 and negative Cardiolite in     2003.  MEDICATIONS: 1. Lorazepam 1 mg p.o. at bedtime p.r.n. 2. Lipitor 20 mg p.o. daily. 3. Metformin 1000 mg p.o. b.i.d. 4. Sertraline 50 mg p.o. daily. 5. Amlodipine 10 mg daily. 6. Symbicort 150/4.5 mcg 1 puff b.i.d. 7. Fish oil once a day. 8. ProAir as  needed. 9. Cymbalta and Lyrica doses yet to be confirmed.  ALLERGIES:  No known drug allergies.  SOCIAL HISTORY:  He lives at home and ambulates without assistance. Former smoker, quit over 30 years ago.  No history of alcohol use.  FAMILY HISTORY:  Due to advanced age is noncontributory.  REVIEW OF SYSTEMS:  Negative except per HPI.  PHYSICAL EXAMINATION:  VITAL SIGNS:  Temperature is 98, pulse 104, blood pressure 160/83, respirations 22, satting 95% on 2 L. GENERAL:  He is averagely built Caucasian gentleman, lying in the stretcher, in no acute distress. HEENT:  Pupils round and reactive to light.  Extraocular movements intact. NECK:  No JVD or lymphadenopathy. CARDIOVASCULAR:  S1 and S2, regular rate and rhythm.  I am not able to appreciate any murmurs. LUNGS:  Clear to auscultation bilaterally. ABDOMEN:  There is slight tenderness in the epigastric area, but otherwise no rigidity, rebound, and normal bowel sounds. EXTREMITIES:  No edema, clubbing, or cyanosis. NEURO:  Moves all extremities and grossly nonfocal.  LABORATORY DATA:  White count of 14.6 with monocytosis, hemoglobin 13.3, platelets of 372.  Chemistries, coags  normal, D-dimer is 0.39.  BMET: Sodium 136, potassium 4.3, chloride 100, bicarb 28, BUN 68, creatinine 1.2, calcium 9.8.  Cardiac markers x1 negative.  UA normal. Chest x-ray, no acute cardiopulmonary disease.  ASSESSMENT/PLAN: 1. Alejandro Hall is a 77 year old gentleman with severe pleuritic chest     pain, suspect pericarditis as the etiology for this.  However, due     to his multiple risk factors also admitted to tele to rule out ACS.     We will repeat an EKG in the morning and check a 2-D echo to     confirm the diagnosis.  We will also check TSH level.  Start an     NSAID trial 600 mg three times a day with meals along with the PPI.     If no improvement, could try colchicine.  I have consulted Dr.     Donnie Aho covering for [facility]. 2. Chronic  obstructive pulmonary disease, on home O2, stable.     Continue with Symbicort and albuterol p.r.n. 3. Diabetes.  We will put him on sliding scale insulin.  Hold his     metformin for now. 4. Hypertension.  Continue home medications. 5. Hyperkalemia.  We will hydrate with normal saline.  We will give     him low-dose Kayexalate and repeat BMET this afternoon. 6. Further management as condition evolves.     Zannie Cove, MD     PJ/MEDQ  D:  12/07/2010  T:  12/07/2010  Job:  [id number]  cc:   Thora Lance, M.D.  Electronically Signed by Zannie Cove  on 12/16/2010 08:29:11 PM

## 2010-12-19 NOTE — Discharge Summary (Signed)
Alejandro Hall, Alejandro Hall NO.:  [id number]  MEDICAL RECORD NO.:  [id number]           PATIENT TYPE:  I  LOCATION:  3708                         FACILITY:  [facility]  PHYSICIAN:  Thora Lance, M.D.  DATE OF BIRTH:  1928-10-04  DATE OF ADMISSION:  12/07/2010 DATE OF DISCHARGE:  12/11/2010                              DISCHARGE SUMMARY   REASON FOR ADMISSION:  A 77 year old white male with history of COPD, diabetes mellitus, hypertension, and posttraumatic neuralgia who presented with chest pain of 2 days duration.  It is described as ache across his chest as well as between his shoulder blades.  The pain is pleuritic, worse with deep inspiration and also lying down.  EKG in the ER showed PR depressions as well as mild diffuse ST elevation suggesting possible pericarditis.  SIGNIFICANT FINDINGS:  VITAL SIGNS:  Temperature 98, heart rate 104, blood pressure 160/83, and respirations 22. LUNGS:  Clear. HEART:  Regular rate and rhythm without murmur, gallop, or rub. ABDOMEN:  Soft and nontender.  Normal bowel sounds.  No mass. EXTREMITIES:  No edema.  LABORATORY DATA:  WBC 14.6, hemoglobin 13.3, and platelet count 372. Chemistries:  Sodium 136, potassium 4.3, chloride, bicarbonate 28, BUN 68, creatinine 1.2, and calcium 9.8.  Urinalysis normal.  Chest x-ray showed no acute cardiopulmonary disease.  D-dimer was 0.39.  HOSPITAL COURSE:  The patient was admitted to rule out acute coronary syndrome.  His cardiac enzymes were negative.  He was seen by Dr. Tresa Endo of Cardiology Service.  It was Dr. York Spaniel assessment that there was no evidence of acute coronary syndrome.  He felt that the chest pain was consistent with pericarditis and he was placed on aspirin, Naprosyn, and colchicine.  PLAN:  An echocardiogram was done on December 08, 2010, and showed normal left ventricular function, normal cavity size, mild dilation of left atrium and right atrium, pulmonary artery peak  pressure 42 and no evidence of pericardial fluid.  The patient's chest pain resolved by discharge with antiinflammatory treatment.  Aspirin and colchicine were stopped and will be continued on 4 more days of Naprosyn with GI prophylaxis with a PPI.  The patient's creatinine did bump up to 1.4, but at discharge it was down to 0.9.  IV fluids were discontinued.  The patient's COPD was stable and diabetes was well controlled during this hospitalization.  DISCHARGE DIAGNOSES: 1. Acute pericarditis. 2. Chronic obstructive pulmonary disease. 3. Diabetes mellitus, type 2. 4. Recent herpes zoster with postherpetic neuralgia. 5. Hypertension. 6. Dyslipidemia. 7. Gastroesophageal reflux disease. 8. Depression. 9. History of kidney stones.  PROCEDURE:  Two-D echocardiogram.  DISCHARGE MEDICATIONS: 1. Naprosyn 500 mg 1 twice a day. 2. Omeprazole 20 mg 1 a day. 3. Amlodipine 10 mg 1 a day. 4. Cymbalta 60 mg 1 a day. 5. Fish oil 2 capsules by mouth daily. 6. Hydrochlorothiazide 12.5 mg 1 tablet once a day. 7. Lipitor 20 mg 1 a day. 8. Lorazepam 1 mg 1 tablet at bedtime as needed. 9. Lyrica 150 mg 1 tablet twice a day. 10.Metformin 1000 mg 1 tablet twice a day. 11.Symbicort 165/4.5  mcg 2 puffs inhaled twice a day. 12.Senokot 2 tablets by mouth at bedtime.  DISPOSITION:  Discharged to home.  CODE STATUS:  Full code.  Oxygen 2 liters nasal cannula.  DIET:  Low-sodium, diabetic diet.  ACTIVITY:  As tolerated.  FOLLOWUP:  2 weeks Dr. Valentina Lucks.          ______________________________ Thora Lance, M.D.     JJG/MEDQ  D:  12/11/2010  T:  12/11/2010  Job:  [id number]  Electronically Signed by Kirby Funk M.D. on 12/19/2010 08:24:35 AM

## 2014-08-12 ENCOUNTER — Encounter (HOSPITAL_COMMUNITY): Payer: Self-pay

## 2014-08-12 ENCOUNTER — Emergency Department (HOSPITAL_COMMUNITY): Payer: Medicare Other

## 2014-08-12 ENCOUNTER — Inpatient Hospital Stay (HOSPITAL_COMMUNITY)
Admission: EM | Admit: 2014-08-12 | Discharge: 2014-08-17 | DRG: 190 | Disposition: A | Discharge status: Home Health | Payer: Medicare Other | Attending: Family Medicine | Admitting: Family Medicine

## 2014-08-12 DIAGNOSIS — J441 Chronic obstructive pulmonary disease with (acute) exacerbation: Secondary | ICD-10-CM | POA: Diagnosis present

## 2014-08-12 DIAGNOSIS — I272 Other secondary pulmonary hypertension: Secondary | ICD-10-CM | POA: Diagnosis present

## 2014-08-12 DIAGNOSIS — R0602 Shortness of breath: Secondary | ICD-10-CM | POA: Diagnosis present

## 2014-08-12 DIAGNOSIS — E119 Type 2 diabetes mellitus without complications: Secondary | ICD-10-CM

## 2014-08-12 DIAGNOSIS — Z7982 Long term (current) use of aspirin: Secondary | ICD-10-CM | POA: Diagnosis not present

## 2014-08-12 DIAGNOSIS — Z79899 Other long term (current) drug therapy: Secondary | ICD-10-CM | POA: Diagnosis not present

## 2014-08-12 DIAGNOSIS — Z794 Long term (current) use of insulin: Secondary | ICD-10-CM

## 2014-08-12 DIAGNOSIS — Z9981 Dependence on supplemental oxygen: Secondary | ICD-10-CM

## 2014-08-12 DIAGNOSIS — I10 Essential (primary) hypertension: Secondary | ICD-10-CM | POA: Diagnosis present

## 2014-08-12 DIAGNOSIS — Z87891 Personal history of nicotine dependence: Secondary | ICD-10-CM

## 2014-08-12 DIAGNOSIS — G4733 Obstructive sleep apnea (adult) (pediatric): Secondary | ICD-10-CM | POA: Diagnosis present

## 2014-08-12 DIAGNOSIS — J9601 Acute respiratory failure with hypoxia: Secondary | ICD-10-CM | POA: Diagnosis present

## 2014-08-12 DIAGNOSIS — Z7952 Long term (current) use of systemic steroids: Secondary | ICD-10-CM

## 2014-08-12 DIAGNOSIS — J9801 Acute bronchospasm: Secondary | ICD-10-CM | POA: Diagnosis not present

## 2014-08-12 HISTORY — DX: Unspecified convulsions: R56.9

## 2014-08-12 HISTORY — DX: Chronic obstructive pulmonary disease, unspecified: J44.9

## 2014-08-12 LAB — CBC
HCT: 42.7 % (ref 39.0–52.0)
HEMATOCRIT: 45.6 % (ref 39.0–52.0)
HEMOGLOBIN: 13.9 g/dL (ref 13.0–17.0)
Hemoglobin: 15.1 g/dL (ref 13.0–17.0)
MCH: 29 pg (ref 26.0–34.0)
MCH: 27.9 pg (ref 26.0–34.0)
MCHC: 33.1 g/dL (ref 30.0–36.0)
MCHC: 32.6 g/dL (ref 30.0–36.0)
MCV: 87.5 fL (ref 78.0–100.0)
MCV: 85.6 fL (ref 78.0–100.0)
PLATELETS: 257 10*3/uL (ref 150–400)
PLATELETS: 252 10*3/uL (ref 150–400)
RBC: 5.21 MIL/uL (ref 4.22–5.81)
RBC: 4.99 MIL/uL (ref 4.22–5.81)
RDW: 14.8 % (ref 11.5–15.5)
RDW: 14.9 % (ref 11.5–15.5)
WBC: 12.8 10*3/uL — ABNORMAL HIGH (ref 4.0–10.5)
WBC: 6.8 10*3/uL (ref 4.0–10.5)

## 2014-08-12 LAB — BASIC METABOLIC PANEL
ANION GAP: 17 — AB (ref 5–15)
BUN: 12 mg/dL (ref 6–23)
CALCIUM: 8.5 mg/dL (ref 8.4–10.5)
CO2: 22 meq/L (ref 19–32)
CREATININE: 0.91 mg/dL (ref 0.50–1.35)
Chloride: 99 mEq/L (ref 96–112)
GFR calc Af Amer: 88 mL/min — ABNORMAL LOW (ref >90)
GFR calc non Af Amer: 76 mL/min — ABNORMAL LOW (ref >90)
Glucose, Bld: 181 mg/dL — ABNORMAL HIGH (ref 70–99)
Potassium: 4.1 mEq/L (ref 3.7–5.3)
Sodium: 138 mEq/L (ref 137–147)

## 2014-08-12 LAB — GLUCOSE, CAPILLARY
GLUCOSE-CAPILLARY: 183 mg/dL — AB (ref 70–99)
Glucose-Capillary: 135 mg/dL — ABNORMAL HIGH (ref 70–99)

## 2014-08-12 LAB — CREATININE, SERUM
CREATININE: 1.09 mg/dL (ref 0.50–1.35)
GFR, EST AFRICAN AMERICAN: 70 mL/min — AB (ref >90)
GFR, EST NON AFRICAN AMERICAN: 61 mL/min — AB (ref >90)

## 2014-08-12 LAB — I-STAT ARTERIAL BLOOD GAS, ED
ACID-BASE DEFICIT: 4 mmol/L — AB (ref 0.0–2.0)
BICARBONATE: 20.1 meq/L (ref 20.0–24.0)
O2 SAT: 100 %
TCO2: 21 mmol/L (ref 0–100)
pCO2 arterial: 33.4 mmHg — ABNORMAL LOW (ref 35.0–45.0)
pH, Arterial: 7.389 (ref 7.350–7.450)
pO2, Arterial: 186 mmHg — ABNORMAL HIGH (ref 80.0–100.0)

## 2014-08-12 LAB — TROPONIN I
Troponin I: <0.3 ng/mL (ref <0.30)
Troponin I: <0.3 ng/mL (ref <0.30)

## 2014-08-12 LAB — MRSA PCR SCREENING: MRSA by PCR: NEGATIVE

## 2014-08-12 LAB — I-STAT TROPONIN, ED: Troponin i, poc: 0 ng/mL (ref 0.00–0.08)

## 2014-08-12 MED ORDER — ENOXAPARIN SODIUM 40 MG/0.4ML ~~LOC~~ SOLN
40.0000 mg | SUBCUTANEOUS | Status: DC
  Administered 2014-08-12 – 2014-08-16 (×5): 40 mg via SUBCUTANEOUS
  Filled 2014-08-12 (×6): qty 0.4

## 2014-08-12 MED ORDER — OXYCODONE HCL 5 MG PO TABS
5.0000 mg | ORAL_TABLET | ORAL | Status: DC | PRN
  Administered 2014-08-14: 5 mg via ORAL
  Filled 2014-08-12: qty 1

## 2014-08-12 MED ORDER — ALBUTEROL SULFATE (2.5 MG/3ML) 0.083% IN NEBU: 2.5000 mg | INHALATION_SOLUTION | RESPIRATORY_TRACT | Status: DC | PRN

## 2014-08-12 MED ORDER — IPRATROPIUM-ALBUTEROL 0.5-2.5 (3) MG/3ML IN SOLN
3.0000 mL | RESPIRATORY_TRACT | Status: DC
  Administered 2014-08-12 – 2014-08-13 (×6): 3 mL via RESPIRATORY_TRACT
  Filled 2014-08-12 (×6): qty 3

## 2014-08-12 MED ORDER — ACETAMINOPHEN 650 MG RE SUPP
650.0000 mg | Freq: Four times a day (QID) | RECTAL | Status: DC | PRN
Start: 2014-08-12 — End: 2014-08-17

## 2014-08-12 MED ORDER — LEVOFLOXACIN IN D5W 750 MG/150ML IV SOLN
750.0000 mg | INTRAVENOUS | Status: DC
Start: 2014-08-12 — End: 2014-08-13
  Administered 2014-08-12 – 2014-08-13 (×2): 750 mg via INTRAVENOUS
  Filled 2014-08-12 (×3): qty 150

## 2014-08-12 MED ORDER — SODIUM CHLORIDE 0.9 % IJ SOLN
3.0000 mL | Freq: Two times a day (BID) | INTRAMUSCULAR | Status: DC
  Administered 2014-08-12 – 2014-08-17 (×11): 3 mL via INTRAVENOUS

## 2014-08-12 MED ORDER — PANTOPRAZOLE SODIUM 40 MG PO TBEC
40.0000 mg | DELAYED_RELEASE_TABLET | Freq: Every day | ORAL | Status: DC
  Administered 2014-08-12 – 2014-08-17 (×6): 40 mg via ORAL
  Filled 2014-08-12 (×6): qty 1

## 2014-08-12 MED ORDER — AMLODIPINE BESYLATE 5 MG PO TABS
5.0000 mg | ORAL_TABLET | Freq: Every day | ORAL | Status: DC
  Administered 2014-08-12 – 2014-08-17 (×6): 5 mg via ORAL
  Filled 2014-08-12 (×6): qty 1

## 2014-08-12 MED ORDER — BENZONATATE 100 MG PO CAPS
200.0000 mg | ORAL_CAPSULE | Freq: Three times a day (TID) | ORAL | Status: DC | PRN
  Administered 2014-08-13 – 2014-08-14 (×2): 200 mg via ORAL
  Filled 2014-08-12 (×3): qty 2

## 2014-08-12 MED ORDER — ASPIRIN EC 81 MG PO TBEC
81.0000 mg | DELAYED_RELEASE_TABLET | Freq: Every day | ORAL | Status: DC
  Administered 2014-08-12 – 2014-08-17 (×6): 81 mg via ORAL
  Filled 2014-08-12 (×6): qty 1

## 2014-08-12 MED ORDER — IPRATROPIUM BROMIDE 0.02 % IN SOLN
0.5000 mg | Freq: Once | RESPIRATORY_TRACT | Status: AC
Start: 2014-08-12 — End: 2014-08-12
  Administered 2014-08-12: 0.5 mg via RESPIRATORY_TRACT
  Filled 2014-08-12: qty 2.5

## 2014-08-12 MED ORDER — ONDANSETRON HCL 4 MG/2ML IJ SOLN: 4.0000 mg | Freq: Four times a day (QID) | INTRAMUSCULAR | Status: DC | PRN

## 2014-08-12 MED ORDER — SENNA 8.6 MG PO TABS
1.0000 | ORAL_TABLET | Freq: Two times a day (BID) | ORAL | Status: DC
  Administered 2014-08-12 – 2014-08-17 (×10): 8.6 mg via ORAL
  Filled 2014-08-12 (×12): qty 1

## 2014-08-12 MED ORDER — METHYLPREDNISOLONE SODIUM SUCC 125 MG IJ SOLR
80.0000 mg | Freq: Three times a day (TID) | INTRAMUSCULAR | Status: DC
  Administered 2014-08-12 – 2014-08-13 (×2): 80 mg via INTRAVENOUS
  Filled 2014-08-12: qty 1.28
  Filled 2014-08-12: qty 2
  Filled 2014-08-12: qty 1.28
  Filled 2014-08-12: qty 2
  Filled 2014-08-12 (×2): qty 1.28

## 2014-08-12 MED ORDER — ALBUTEROL SULFATE (2.5 MG/3ML) 0.083% IN NEBU
5.0000 mg | INHALATION_SOLUTION | Freq: Once | RESPIRATORY_TRACT | Status: AC
  Administered 2014-08-12: 5 mg via RESPIRATORY_TRACT
  Filled 2014-08-12: qty 6

## 2014-08-12 MED ORDER — MORPHINE SULFATE 2 MG/ML IJ SOLN: 1.0000 mg | INTRAMUSCULAR | Status: DC | PRN

## 2014-08-12 MED ORDER — SODIUM CHLORIDE 0.9 % IV SOLN
INTRAVENOUS | Status: DC
  Administered 2014-08-12: 18:00:00 via INTRAVENOUS

## 2014-08-12 MED ORDER — ONDANSETRON HCL 4 MG PO TABS
4.0000 mg | ORAL_TABLET | Freq: Four times a day (QID) | ORAL | Status: DC | PRN
  Administered 2014-08-14: 4 mg via ORAL
  Filled 2014-08-12: qty 1

## 2014-08-12 MED ORDER — GUAIFENESIN-DM 100-10 MG/5ML PO SYRP
5.0000 mL | ORAL_SOLUTION | ORAL | Status: DC | PRN
  Administered 2014-08-13 – 2014-08-14 (×2): 5 mL via ORAL
  Filled 2014-08-12 (×4): qty 5

## 2014-08-12 MED ORDER — BUDESONIDE 0.25 MG/2ML IN SUSP
0.2500 mg | Freq: Two times a day (BID) | RESPIRATORY_TRACT | Status: DC
  Administered 2014-08-12 – 2014-08-17 (×10): 0.25 mg via RESPIRATORY_TRACT
  Filled 2014-08-12 (×12): qty 2

## 2014-08-12 MED ORDER — ACETAMINOPHEN 325 MG PO TABS
650.0000 mg | ORAL_TABLET | Freq: Four times a day (QID) | ORAL | Status: DC | PRN
  Administered 2014-08-13 – 2014-08-14 (×3): 650 mg via ORAL
  Filled 2014-08-12 (×3): qty 2

## 2014-08-12 MED ORDER — INSULIN ASPART 100 UNIT/ML ~~LOC~~ SOLN
0.0000 [IU] | Freq: Three times a day (TID) | SUBCUTANEOUS | Status: DC
  Administered 2014-08-12: 2 [IU] via SUBCUTANEOUS
  Administered 2014-08-13: 7 [IU] via SUBCUTANEOUS
  Administered 2014-08-13: 3 [IU] via SUBCUTANEOUS
  Administered 2014-08-13 – 2014-08-14 (×2): 5 [IU] via SUBCUTANEOUS
  Administered 2014-08-14: 7 [IU] via SUBCUTANEOUS
  Administered 2014-08-14: 3 [IU] via SUBCUTANEOUS
  Administered 2014-08-15: 9 [IU] via SUBCUTANEOUS
  Administered 2014-08-15: 3 [IU] via SUBCUTANEOUS

## 2014-08-12 MED ORDER — HYDRALAZINE HCL 20 MG/ML IJ SOLN: 10.0000 mg | Freq: Four times a day (QID) | INTRAMUSCULAR | Status: DC | PRN

## 2014-08-12 NOTE — ED Notes (Signed)
[facility], has COPD, increased SOB for the past week, worse today, been this way all day. PTA received 10 albuloterol, .5 Atrovent, 125 solumedrol, 2 gm mag sulfate, 4 of zofran

## 2014-08-12 NOTE — ED Notes (Signed)
Attempted to give report 

## 2014-08-12 NOTE — H&P (Signed)
PATIENT DETAILS Name: Alejandro Shoalsoel L Shiffer Age: 80 y.o. Sex: male Date of Birth: 04/24/1929 Admit Date: 08/12/2014 PCP:No primary care provider on file.   CHIEF COMPLAINT:  Shortness of breath  HPI: Alejandro Hall is a 80 y.o. male with a Past Medical History of COPD on home O2, diabetes, hypertension who presents today with the above noted complaint. Per patient, for the past one week or so, he has had persistent cough, cold and nausea. Over the past few days he has had slow worsening of her shortness of breath. He claims that at baseline, he is just about able to walk from his house to his driveway only. Over the past few days he had worsening of shortness of breath, not responsive to his inhalers. Last night this became particularly worse, patient could not sleep. He thought he was "dying". Subsequently EMS was called, patient was given an hour long nebulized albuterol, magnesium, Solu-Medrol, placed on BiPAP. With these measures patient felt somewhat better, I was subsequently asked to admit this patient for further evaluation and treatment. Patient claims he has had chills but no obvious fever for the past few days. He denies any headache or chest pain. He denies any abdominal pain or vomiting. Denies any dysuria.  ALLERGIES:  No Known Allergies  PAST MEDICAL HISTORY: Past Medical History  Diagnosis Date  . COPD (chronic obstructive pulmonary disease)   . Seizures     PAST SURGICAL HISTORY: History reviewed. No pertinent past surgical history.  MEDICATIONS AT HOME: Prior to Admission medications   Medication Sig Start Date End Date Taking? Authorizing Provider  PRESCRIPTION MEDICATION Take 1 puff by mouth daily. Inhaler   Yes Historical Provider, MD    FAMILY HISTORY: No family history on file.  SOCIAL HISTORY:  reports that he has quit smoking. He has never used smokeless tobacco. He reports that he does not drink alcohol. His drug history is not on file.  REVIEW OF  SYSTEMS:  Constitutional:   No  weight loss, night sweats,  Fevers, chills, fatigue.  HEENT:    No headaches, Difficulty swallowing,Tooth/dental problems,Sore throat,   Cardio-vascular: No chest pain,  Orthopnea, PND, swelling in lower extremities, anasarca  GI:  No heartburn, indigestion, abdominal pain, nausea, vomiting, diarrhea.  Resp:  No coughing up of blood.No chest wall deformity  Skin:  no rash or lesions.  GU:  no dysuria, change in color of urine, no urgency or frequency.  No flank pain.  Musculoskeletal: No joint pain or swelling.  No decreased range of motion.  No back pain.  Psych: No change in mood or affect. No depression or anxiety.  No memory loss.   PHYSICAL EXAM: Blood pressure 129/56, pulse 98, resp. rate 17, height 5\' 8"  (1.727 m), weight 83.915 kg (185 lb), SpO2 95 %.  General appearance :Awake, alert, and moderate amount of respiratory distress-just about able to speak in full sentences when taken off the BiPAP.  HEENT: Atraumatic and Normocephalic, pupils equally reactive to light and accomodation Neck: supple, no JVD. No cervical lymphadenopathy.  Chest: Patient is moving air bilaterally-however rhonchi heard all over. CVS: S1 S2 regular, no murmurs.  Abdomen: Bowel sounds present, Non tender and not distended with no gaurding, rigidity or rebound. Extremities: B/L Lower Ext shows no edema, both legs are warm to touch Neurology:  Non focal Skin:No Rash Wounds:N/A  LABS ON ADMISSION:   Recent Labs  08/12/14 0515  NA 138  K 4.1  CL  99  CO2 22  GLUCOSE 181*  BUN 12  CREATININE 0.91  CALCIUM 8.5   No results for input(s): AST, ALT, ALKPHOS, BILITOT, PROT, ALBUMIN in the last 72 hours. No results for input(s): LIPASE, AMYLASE in the last 72 hours.  Recent Labs  08/12/14 0515  WBC 12.8*  HGB 15.1  HCT 45.6  MCV 87.5  PLT 257   No results for input(s): CKTOTAL, CKMB, CKMBINDEX, TROPONINI in the last 72 hours. No results for  input(s): DDIMER in the last 72 hours. Invalid input(s): POCBNP   RADIOLOGIC STUDIES ON ADMISSION: Dg Chest Portable 1 View  08/12/2014   CLINICAL DATA:  Acute onset of shortness of breath and cough. Personal history of smoking. Initial encounter.  EXAM: PORTABLE CHEST - 1 VIEW  COMPARISON:  Chest radiograph performed 12/07/2010  FINDINGS: The lungs are hyperexpanded, likely reflecting COPD. The costophrenic angles are incompletely imaged on this study. There is no evidence of focal opacification, pleural effusion or pneumothorax.  The cardiomediastinal silhouette is borderline normal in size. No acute osseous abnormalities are seen.  IMPRESSION: No acute cardiopulmonary process seen.  Findings of COPD.   Electronically Signed   By: Roanna RaiderJeffery  Chang M.D.   On: 08/12/2014 06:27     EKG: Independently reviewed. Sinus tachycardia  ASSESSMENT AND PLAN: Present on Admission:  . Acute respiratory failure with hypoxia: Secondary to COPD exacerbation, will admit to stepdown unit, BiPAP as needed. Patient will be started on Solu-Medrol, scheduled bronchodilators, empiric Levaquin treatment. He will be followed closely, once he is off the BiPAP, diet will be advanced. He will be followed and monitored closely.   Marland Kitchen. COPD with exacerbation: Likely causing above. Not sure what inhaler regimen he is at home. For now, start IV Solu-Medrol, bronchodilator nebs, empiric Levaquin and follow.   . DM-2: Patient not able to tell me whether he takes oral hypoglycemic agents at home-given the thigh that he is on BiPAP, placed on SSI, follow CBGs. Pharmacy med reconciliation pending.   Marland Kitchen. HTN (hypertension): Not sure what antihypertensive regimen he is on home-pharmacy med reconciliation pending. Start amlodipine and follow BP trend.  Further plan will depend as patient's clinical course evolves and further radiologic and laboratory data become available. Patient will be monitored closely.  Above noted plan was  discussed with patient, he was in agreement.   DVT Prophylaxis: Prophylactic Lovenox  Code Status: Full Code  Disposition Plan: Home when stable    Total time spent for admission equals 45 minutes.  [facility] CenterGHIMIRE,SHANKER [facility] Pager [phone]  If 7PM-7AM, please contact night-coverage [email or web address] Password [email or web address] 08/12/2014, 8:26 AM

## 2014-08-12 NOTE — ED Notes (Signed)
RT called for transport

## 2014-08-12 NOTE — ED Provider Notes (Signed)
CSN: [id number]     Arrival date & time 08/12/14  0505 History   First MD Initiated Contact with Patient 08/12/14 0550     Chief Complaint  Patient presents with  . Shortness of Breath   Level V caveat for respiratory distress  (Consider location/radiation/quality/duration/timing/severity/associated sxs/prior Treatment) HPI  Patient reports he has been having a dry cough for the past week without fever. He denies sore throat but has had clear rhinorrhea and sneezing. He states his breathing got a lot worse tonight and his chest got very tight. He presents via EMS after receiving albuterol 10 mg, Atrovent 5 mg, Solu-Medrol 125 mg, 2 g magnesium sulfate, and 4 mg of Zofran. Patient indicates he thought he was dying however he is feeling better now but he still feels short of breath.    PCP Dr Roseanne RenoJ Griffin   Past Medical History  Diagnosis Date  . COPD (chronic obstructive pulmonary disease)   . Seizures    History reviewed. No pertinent past surgical history. No family history on file. History  Substance Use Topics  . Smoking status: Former Games developermoker  . Smokeless tobacco: Never Used  . Alcohol Use: No  lives at home Lives alone Uses home oxygen 2 lpm [place]  Review of Systems  Unable to perform ROS: Other      Allergies  Review of patient's allergies indicates no known allergies.  Home Medications   Prior to Admission medications   Medication Sig Start Date End Date Taking? Authorizing Provider  PRESCRIPTION MEDICATION Take 1 puff by mouth daily. Inhaler   Yes Historical Provider, MD   BP 173/70 mmHg  Pulse 109  Resp 25  Ht 5\' 8"  (1.727 m)  Wt 185 lb (83.915 kg)  BMI 28.14 kg/m2  SpO2 98%  Vital signs normal except tachycardia  Physical Exam  Constitutional: He is oriented to person, place, and time. He appears well-developed and well-nourished.  Non-toxic appearance. He does not appear ill. No distress.  Pt on BiPap, so communication is difficult  HENT:  Head:  Normocephalic and atraumatic.  Right Ear: External ear normal.  Left Ear: External ear normal.  Nose: Nose normal. No mucosal edema or rhinorrhea.  Mouth/Throat: Mucous membranes are normal. No dental abscesses or uvula swelling.  Eyes: Conjunctivae and EOM are normal. Pupils are equal, round, and reactive to light.  Neck: Normal range of motion and full passive range of motion without pain. Neck supple.  Cardiovascular: Normal rate, regular rhythm and normal heart sounds.  Exam reveals no gallop and no friction rub.   No murmur heard. Pulmonary/Chest: Accessory muscle usage present. Tachypnea noted. No respiratory distress. He has decreased breath sounds. He has wheezes. He has no rhonchi. He has no rales. He exhibits no tenderness and no crepitus.  Abdominal: Soft. Normal appearance and bowel sounds are normal. He exhibits no distension. There is no tenderness. There is no rebound and no guarding.  Musculoskeletal: Normal range of motion. He exhibits no edema or tenderness.  Moves all extremities well.   Neurological: He is alert and oriented to person, place, and time. He has normal strength. No cranial nerve deficit.  Skin: Skin is warm, dry and intact. No rash noted. No erythema. No pallor.  Psychiatric: He has a normal mood and affect. His speech is normal and behavior is normal. His mood appears not anxious.  Nursing note and vitals reviewed.   ED Course  Procedures (including critical care time)  Medications  albuterol (PROVENTIL) (2.5 MG/3ML) 0.083%  nebulizer solution 5 mg (not administered)  ipratropium (ATROVENT) nebulizer solution 0.5 mg (not administered)   Patient was maintained on BiPAP started by EMS.  07:28 Dr Jerral Ralph admit to step down   Labs Review Results for orders placed or performed during the hospital encounter of 08/12/14  Basic metabolic panel    (if pt has PMH of COPD)  Result Value Ref Range   Sodium 138 137 - 147 mEq/L   Potassium 4.1 3.7 - 5.3 mEq/L    Chloride 99 96 - 112 mEq/L   CO2 22 19 - 32 mEq/L   Glucose, Bld 181 (H) 70 - 99 mg/dL   BUN 12 6 - 23 mg/dL   Creatinine, Ser 4.09 0.50 - 1.35 mg/dL   Calcium 8.5 8.4 - 81.1 mg/dL   GFR calc non Af Amer 76 (L) >90 mL/min   GFR calc Af Amer 88 (L) >90 mL/min   Anion gap 17 (H) 5 - 15  CBC     (if pt has PMH of COPD)  Result Value Ref Range   WBC 12.8 (H) 4.0 - 10.5 K/uL   RBC 5.21 4.22 - 5.81 MIL/uL   Hemoglobin 15.1 13.0 - 17.0 g/dL   HCT 91.4 78.2 - 95.6 %   MCV 87.5 78.0 - 100.0 fL   MCH 29.0 26.0 - 34.0 pg   MCHC 33.1 30.0 - 36.0 g/dL   RDW 21.3 08.6 - 57.8 %   Platelets 257 150 - 400 K/uL  I-stat troponin, ED (if patient has history of COPD)  Result Value Ref Range   Troponin i, poc 0.00 0.00 - 0.08 ng/mL   Comment 3          I-Stat arterial blood gas, ED  Result Value Ref Range   pH, Arterial 7.389 7.350 - 7.450   pCO2 arterial 33.4 (L) 35.0 - 45.0 mmHg   pO2, Arterial 186.0 (H) 80.0 - 100.0 mmHg   Bicarbonate 20.1 20.0 - 24.0 mEq/L   TCO2 21 0 - 100 mmol/L   O2 Saturation 100.0 %   Acid-base deficit 4.0 (H) 0.0 - 2.0 mmol/L   Patient temperature 98.6 F    Collection site RADIAL, ALLEN'S TEST ACCEPTABLE    Drawn by Operator    Sample type ARTERIAL    Laboratory interpretation all normal except  leukocytosis, hyperglycemia, normal ABG on BiPAP     Imaging Review Dg Chest Portable 1 View  08/12/2014   CLINICAL DATA:  Acute onset of shortness of breath and cough. Personal history of smoking. Initial encounter.  EXAM: PORTABLE CHEST - 1 VIEW  COMPARISON:  Chest radiograph performed 12/07/2010  FINDINGS: The lungs are hyperexpanded, likely reflecting COPD. The costophrenic angles are incompletely imaged on this study. There is no evidence of focal opacification, pleural effusion or pneumothorax.  The cardiomediastinal silhouette is borderline normal in size. No acute osseous abnormalities are seen.  IMPRESSION: No acute cardiopulmonary process seen.  Findings of  COPD.   Electronically Signed   By: Roanna Raider M.D.   On: 08/12/2014 06:27     EKG Interpretation   Date/Time:  Saturday August 12 2014 05:13:07 EST Ventricular Rate:  110 PR Interval:  190 QRS Duration: 82 QT Interval:  322 QTC Calculation: 435 R Axis:   40 Text Interpretation:  Sinus tachycardia Since last tracing Borderline ST  depression, diffuse leads HEART RATE INCREASED SINCE 08 Dec 2010 Electrode  noise Confirmed by [facility]  MD-I, Onia Shiflett ([id number]) on 08/12/2014 5:38:37 AM  MDM   Final diagnoses:  COPD exacerbation   Plan admission   Devoria AlbeIva Hibo Blasdell, MD, FACEP   CRITICAL CARE Performed by: Devoria AlbeKNAPP,Spurgeon Gancarz L Total critical care time: 38 min Critical care time was exclusive of separately billable procedures and treating other patients. Critical care was necessary to treat or prevent imminent or life-threatening deterioration. Critical care was time spent personally by me on the following activities: development of treatment plan with patient and/or surrogate as well as nursing, discussions with consultants, evaluation of patient's response to treatment, examination of patient, obtaining history from patient or surrogate, ordering and performing treatments and interventions, ordering and review of laboratory studies, ordering and review of radiographic studies, pulse oximetry and re-evaluation of patient's condition.     Ward GivensIva L Julien Oscar, MD 08/12/14 [phone]

## 2014-08-12 NOTE — Progress Notes (Signed)
Pt taen off Bipap for breathing treatment, placed back on once tx finished due to increased RR and increased work of breathing. RT will continue to follow.

## 2014-08-13 LAB — CBC
HCT: 41.5 % (ref 39.0–52.0)
Hemoglobin: 13.5 g/dL (ref 13.0–17.0)
MCH: 27.9 pg (ref 26.0–34.0)
MCHC: 32.5 g/dL (ref 30.0–36.0)
MCV: 85.7 fL (ref 78.0–100.0)
Platelets: 251 10*3/uL (ref 150–400)
RBC: 4.84 MIL/uL (ref 4.22–5.81)
RDW: 15.1 % (ref 11.5–15.5)
WBC: 12.9 10*3/uL — ABNORMAL HIGH (ref 4.0–10.5)

## 2014-08-13 LAB — BASIC METABOLIC PANEL
Anion gap: 16 — ABNORMAL HIGH (ref 5–15)
BUN: 25 mg/dL — ABNORMAL HIGH (ref 6–23)
CALCIUM: 8.4 mg/dL (ref 8.4–10.5)
CO2: 21 mEq/L (ref 19–32)
Chloride: 98 mEq/L (ref 96–112)
Creatinine, Ser: 1.17 mg/dL (ref 0.50–1.35)
GFR calc Af Amer: 65 mL/min — ABNORMAL LOW (ref >90)
GFR, EST NON AFRICAN AMERICAN: 56 mL/min — AB (ref >90)
GLUCOSE: 260 mg/dL — AB (ref 70–99)
Potassium: 4.1 mEq/L (ref 3.7–5.3)
Sodium: 135 mEq/L — ABNORMAL LOW (ref 137–147)

## 2014-08-13 LAB — GLUCOSE, CAPILLARY
GLUCOSE-CAPILLARY: 262 mg/dL — AB (ref 70–99)
Glucose-Capillary: 243 mg/dL — ABNORMAL HIGH (ref 70–99)
Glucose-Capillary: 303 mg/dL — ABNORMAL HIGH (ref 70–99)
Glucose-Capillary: 197 mg/dL — ABNORMAL HIGH (ref 70–99)

## 2014-08-13 LAB — TROPONIN I: Troponin I: <0.3 ng/mL (ref <0.30)

## 2014-08-13 LAB — HEMOGLOBIN A1C
HEMOGLOBIN A1C: 6.9 % — AB (ref <5.7)
MEAN PLASMA GLUCOSE: 151 mg/dL — AB (ref <117)

## 2014-08-13 MED ORDER — METHYLPREDNISOLONE SODIUM SUCC 125 MG IJ SOLR
60.0000 mg | Freq: Three times a day (TID) | INTRAMUSCULAR | Status: DC
  Administered 2014-08-13 – 2014-08-15 (×6): 60 mg via INTRAVENOUS
  Filled 2014-08-13 (×9): qty 0.96

## 2014-08-13 MED ORDER — IPRATROPIUM-ALBUTEROL 0.5-2.5 (3) MG/3ML IN SOLN
3.0000 mL | Freq: Three times a day (TID) | RESPIRATORY_TRACT | Status: DC
  Administered 2014-08-13 – 2014-08-14 (×2): 3 mL via RESPIRATORY_TRACT
  Filled 2014-08-13: qty 3

## 2014-08-13 MED ORDER — LEVOFLOXACIN IN D5W 750 MG/150ML IV SOLN: 750.0000 mg | INTRAVENOUS | Status: DC

## 2014-08-13 NOTE — Progress Notes (Signed)
[facility]. Patient to be transferred via wheelchair.

## 2014-08-13 NOTE — Progress Notes (Signed)
Patient arrived on unit from [facility], alert & oriented, placed on telemetry per MD order.

## 2014-08-13 NOTE — Progress Notes (Signed)
Utilization Review Completed.Hanish Laraia T12/20/2015  

## 2014-08-13 NOTE — Progress Notes (Signed)
PATIENT DETAILS Name: Alejandro Hall Age: 80 y.o. Sex: male Date of Birth: 04/18/1929 Admit Date: 08/12/2014 Admitting Physician Dewayne ShorterShanker Levora DredgeM Kaylub Detienne, MD PCP:No primary care provider on file.  Subjective: Much better-claims SOB almost at baseline  Assessment/Plan: Principal Problem: Acute on chronic respiratory failure with hypoxia: Secondary to COPD exacerbation, much better. Off BiPAP, now on 2L/m OF o2 via [place].Decrease solumedrol, c/w scheduled bronchodilators, empiric Levaquin. Transfer out of SDU.   Active Problems:   COPD with exacerbation: Likely causing above. Much improved. On Home O2 2l/m. Marland Kitchen.Decrease solumedrol, c/w scheduled bronchodilators, empiric Levaquin.Ambulate with PT.    DM-2: not on any home medications-on diet/excercise regimen, A1C 6.9-CBG's mildly elevated but patient on steroids, c/w SSI while inpatient.    HTN (hypertension):controlled with Amlodipine.  Disposition: Remain inpatient-transfer out of SDU  Antibiotics:  See below   Anti-infectives    Start     Dose/Rate Route Frequency Ordered Stop   08/12/14 1500  levofloxacin (LEVAQUIN) IVPB 750 mg     750 mg100 mL/hr over 90 Minutes Intravenous Every 24 hours 08/12/14 1357        DVT Prophylaxis: Prophylactic Lovenox   Code Status: Full code   Family Communication None at bedside-patient alert-understands plan of care as outlined above  Procedures:  None  CONSULTS:  None  Time spent 40 minutes-which includes 50% of the time with face-to-face with patient/ family and coordinating care related to the above assessment and plan.  MEDICATIONS: Scheduled Meds: . amLODipine  5 mg Oral Daily  . aspirin EC  81 mg Oral Daily  . budesonide (PULMICORT) nebulizer solution  0.25 mg Nebulization BID  . enoxaparin (LOVENOX) injection  40 mg Subcutaneous Q24H  . insulin aspart  0-9 Units Subcutaneous TID WC  . ipratropium-albuterol  3 mL Nebulization Q4H  . levofloxacin (LEVAQUIN) IV  750 mg  Intravenous Q24H  . methylPREDNISolone (SOLU-MEDROL) injection  60 mg Intravenous 3 times per day  . pantoprazole  40 mg Oral QAC breakfast  . senna  1 tablet Oral BID  . sodium chloride  3 mL Intravenous Q12H   Continuous Infusions:  PRN Meds:.acetaminophen **OR** acetaminophen, albuterol, benzonatate, guaiFENesin-dextromethorphan, hydrALAZINE, morphine injection, ondansetron **OR** ondansetron (ZOFRAN) IV, oxyCODONE    PHYSICAL EXAM: Vital signs in last 24 hours: Filed Vitals:   08/12/14 2338 08/13/14 0314 08/13/14 0444 08/13/14 0755  BP: 157/64  135/52 149/106  Pulse: 106  89 88  Temp: 98.2 F (36.8 C)  98 F (36.7 C) 97.8 F (36.6 C)  TempSrc: Oral  Oral Oral  Resp: 28  20 32  Height:      Weight:      SpO2: 94% 94% 93% 96%    Weight change: -2.115 kg (-4 lb 10.6 oz) Filed Weights   08/12/14 0512 08/12/14 1358  Weight: 83.915 kg (185 lb) 81.8 kg (180 lb 5.4 oz)   Body mass index is 27.43 kg/(m^2).   Gen Exam: Awake and alert with clear speech.   Neck: Supple, No JVD.   Chest: Much better air entry-hardly any rhonchi today CVS: S1 S2 Regular, no murmurs.  Abdomen: soft, BS +, non tender, non distended.  Extremities: no edema, lower extremities warm to touch. Neurologic: Non Focal.   Skin: No Rash.   Wounds: N/A.   Intake/Output from previous day:  Intake/Output Summary (Last 24 hours) at 08/13/14 0911 Last data filed at 08/13/14 0700  Gross per 24 hour  Intake    600 ml  Output    975 ml  Net   -375 ml     LAB RESULTS: CBC  Recent Labs Lab 08/12/14 0515 08/12/14 1522 08/13/14 0135  WBC 12.8* 6.8 12.9*  HGB 15.1 13.9 13.5  HCT 45.6 42.7 41.5  PLT 257 252 251  MCV 87.5 85.6 85.7  MCH 29.0 27.9 27.9  MCHC 33.1 32.6 32.5  RDW 14.8 14.9 15.1    Chemistries   Recent Labs Lab 08/12/14 0515 08/12/14 1522 08/13/14 0135  NA 138  --  135*  K 4.1  --  4.1  CL 99  --  98  CO2 22  --  21  GLUCOSE 181*  --  260*  BUN 12  --  25*  CREATININE  0.91 1.09 1.17  CALCIUM 8.5  --  8.4    CBG:  Recent Labs Lab 08/12/14 1639 08/12/14 2236 08/13/14 0755  GLUCAP 183* 135* 243*    GFR Estimated Creatinine Clearance: 46.3 mL/min (by C-G formula based on Cr of 1.17).  Coagulation profile No results for input(s): INR, PROTIME in the last 168 hours.  Cardiac Enzymes  Recent Labs Lab 08/12/14 1522 08/12/14 1954 08/13/14 0135  TROPONINI <0.30 <0.30 <0.30    Invalid input(s): POCBNP No results for input(s): DDIMER in the last 72 hours.  Recent Labs  08/12/14 1522  HGBA1C 6.9*   No results for input(s): CHOL, HDL, LDLCALC, TRIG, CHOLHDL, LDLDIRECT in the last 72 hours. No results for input(s): TSH, T4TOTAL, T3FREE, THYROIDAB in the last 72 hours.  Invalid input(s): FREET3 No results for input(s): VITAMINB12, FOLATE, FERRITIN, TIBC, IRON, RETICCTPCT in the last 72 hours. No results for input(s): LIPASE, AMYLASE in the last 72 hours.  Urine Studies No results for input(s): UHGB, CRYS in the last 72 hours.  Invalid input(s): UACOL, UAPR, USPG, UPH, UTP, UGL, UKET, UBIL, UNIT, UROB, ULEU, UEPI, UWBC, URBC, UBAC, CAST, UCOM, BILUA  MICROBIOLOGY: Recent Results (from the past 240 hour(s))  MRSA PCR Screening     Status: None   Collection Time: 08/12/14  2:20 PM  Result Value Ref Range Status   MRSA by PCR NEGATIVE NEGATIVE Final    Comment:        The GeneXpert MRSA Assay (FDA approved for NASAL specimens only), is one component of a comprehensive MRSA colonization surveillance program. It is not intended to diagnose MRSA infection nor to guide or monitor treatment for MRSA infections.     RADIOLOGY STUDIES/RESULTS: Dg Chest Portable 1 View  08/12/2014   CLINICAL DATA:  Acute onset of shortness of breath and cough. Personal history of smoking. Initial encounter.  EXAM: PORTABLE CHEST - 1 VIEW  COMPARISON:  Chest radiograph performed 12/07/2010  FINDINGS: The lungs are hyperexpanded, likely reflecting COPD.  The costophrenic angles are incompletely imaged on this study. There is no evidence of focal opacification, pleural effusion or pneumothorax.  The cardiomediastinal silhouette is borderline normal in size. No acute osseous abnormalities are seen.  IMPRESSION: No acute cardiopulmonary process seen.  Findings of COPD.   Electronically Signed   By: Roanna RaiderJeffery  Chang M.D.   On: 08/12/2014 06:27    Jeoffrey MassedGHIMIRE,Arrie Zuercher, MD  [facility] Pager:[phone]  If 7PM-7AM, please contact night-coverage [email or web address] Password [email or web address] 08/13/2014, 9:11 AM   LOS: 1 day

## 2014-08-14 ENCOUNTER — Inpatient Hospital Stay (HOSPITAL_COMMUNITY): Payer: Medicare Other

## 2014-08-14 DIAGNOSIS — I270 Primary pulmonary hypertension: Secondary | ICD-10-CM

## 2014-08-14 DIAGNOSIS — I517 Cardiomegaly: Secondary | ICD-10-CM

## 2014-08-14 LAB — GLUCOSE, CAPILLARY
Glucose-Capillary: 203 mg/dL — ABNORMAL HIGH (ref 70–99)
Glucose-Capillary: 348 mg/dL — ABNORMAL HIGH (ref 70–99)
Glucose-Capillary: 282 mg/dL — ABNORMAL HIGH (ref 70–99)

## 2014-08-14 LAB — PRO B NATRIURETIC PEPTIDE: Pro B Natriuretic peptide (BNP): 704.7 pg/mL — ABNORMAL HIGH (ref 0–450)

## 2014-08-14 MED ORDER — TECHNETIUM TC 99M DIETHYLENETRIAME-PENTAACETIC ACID: 40.0000 | Freq: Once | INTRAVENOUS | Status: AC | PRN

## 2014-08-14 MED ORDER — LEVOFLOXACIN 750 MG PO TABS
750.0000 mg | ORAL_TABLET | Freq: Every day | ORAL | Status: DC
  Administered 2014-08-14 – 2014-08-16 (×3): 750 mg via ORAL
  Filled 2014-08-14 (×4): qty 1

## 2014-08-14 MED ORDER — TECHNETIUM TO 99M ALBUMIN AGGREGATED
6.0000 | Freq: Once | INTRAVENOUS | Status: AC | PRN
  Administered 2014-08-14: 6 via INTRAVENOUS

## 2014-08-14 MED ORDER — IPRATROPIUM-ALBUTEROL 0.5-2.5 (3) MG/3ML IN SOLN
3.0000 mL | Freq: Three times a day (TID) | RESPIRATORY_TRACT | Status: DC
  Administered 2014-08-14 – 2014-08-17 (×8): 3 mL via RESPIRATORY_TRACT
  Filled 2014-08-14 (×8): qty 3

## 2014-08-14 MED ORDER — IPRATROPIUM-ALBUTEROL 0.5-2.5 (3) MG/3ML IN SOLN
3.0000 mL | Freq: Four times a day (QID) | RESPIRATORY_TRACT | Status: DC
  Filled 2014-08-14: qty 3

## 2014-08-14 NOTE — Progress Notes (Signed)
Inpatient Diabetes Program Recommendations  AACE/ADA: New Consensus Statement on Inpatient Glycemic Control (2013)  Target Ranges:  Prepandial:   less than 140 mg/dL      Peak postprandial:   less than 180 mg/dL (1-2 hours)      Critically ill patients:  140 - 180 mg/dL   Reason for Assessment:  Results for Alejandro Hall, Alejandro Hall (MRN [id number]) as of 08/14/2014 12:21  Ref. Range 08/13/2014 11:36 08/13/2014 16:42 08/13/2014 21:57 08/14/2014 07:52 08/14/2014 11:43  Glucose-Capillary Latest Range: 70-99 mg/dL 829262 (H) 562303 (H) 130197 (H) 203 (H) 348 (H)   Diabetes history: Type 2 diabetes Outpatient Diabetes medications: Diet controlled Current orders for Inpatient glycemic control:  Novolog sensitive tid with meals  May consider adding NPH 10 units q AM while patient is on steroids in the hospital.  Also consider increasing Novolog correction to moderate tid with meals and HS.  Thanks, Beryl MeagerJenny Duncan Alejandro, RN, BC-ADM Inpatient Diabetes Coordinator Pager [phone]

## 2014-08-14 NOTE — Progress Notes (Signed)
Physical Therapy Evaluation Patient Details Name: Alejandro Hall MRN: [id number] DOB: 11/24/1928 Today's Date: 08/14/2014   History of Present Illness  Patient is an 80 yo male admitte 08/12/14 with worsening SOB.  Patient with acute resp failure with hypoxia, COPD exacerbation.  PMH:  COPD on home O2, DM, HTN, seizures.  Clinical Impression  Patient presents with problems listed below.  Will benefit from acute PT to maximize independence prior to discharge.  Patient with dyspnea 3/4 - 4/4 with exertion.  Was unable to decrease dyspnea below 2/4 in sitting with deep breathing.  Patient lives alone and was unable to complete ADL's/homemaking pta.  Recommend SNF at discharge for continued therapy.    Follow Up Recommendations SNF;Supervision/Assistance - 24 hour    Equipment Recommendations  None recommended by PT    Recommendations for Other Services       Precautions / Restrictions Precautions Precautions: Fall Precaution Comments: Home O2 Restrictions Weight Bearing Restrictions: No      Mobility  Bed Mobility Overal bed mobility: Modified Independent             General bed mobility comments: Use of bed rail to move to sitting  Transfers Overall transfer level: Needs assistance Equipment used: None Transfers: Sit to/from Stand Sit to Stand: Supervision         General transfer comment: Verbal cues to make transitions slowly for safety.  Assist for safety/balance.  Ambulation/Gait Ambulation/Gait assistance: Min guard Ambulation Distance (Feet): 5 Feet Assistive device: None Gait Pattern/deviations: Step-through pattern;Decreased stride length Gait velocity: Decreased Gait velocity interpretation: Below normal speed for age/gender General Gait Details: Patient with fair balance with gait.  Limited distance due to 3/4 - 4/4 dyspnea with exertion.  Stairs            Wheelchair Mobility    Modified Rankin (Stroke Patients Only)       Balance                                              Pertinent Vitals/Pain Pain Assessment: 0-10 Pain Score: 2  Pain Location: Headache and back pain Pain Descriptors / Indicators: Nagging Pain Intervention(s): Monitored during session    Home Living Family/patient expects to be discharged to:: Skilled nursing facility Living Arrangements: Alone             Home Equipment: Cane - single point (Home O2 at 2 l/min)      Prior Function Level of Independence: Independent;Needs assistance   Gait / Transfers Assistance Needed: Ambulates independently shorter distances only due to increasing dyspnea.  Has started using cane outdoors.  ADL's / Homemaking Assistance Needed: Patient reports he has been unable to clean his floors or make up his bed due to dyspnea.  Reports it takes him several hours to take out the trash - has to do in small increments.        Hand Dominance   Dominant Hand: Right    Extremity/Trunk Assessment   Upper Extremity Assessment: Generalized weakness (Missing first finger on Lt hand; Bursitis Rt elbow)           Lower Extremity Assessment: Generalized weakness      Cervical / Trunk Assessment: Normal  Communication   Communication: No difficulties  Cognition Arousal/Alertness: Awake/alert Behavior During Therapy: Anxious Overall Cognitive Status: Within Functional Limits for tasks assessed  General Comments      Exercises        Assessment/Plan    PT Assessment Patient needs continued PT services  PT Diagnosis Difficulty walking;Abnormality of gait;Generalized weakness   PT Problem List Decreased strength;Decreased activity tolerance;Decreased balance;Decreased mobility;Decreased knowledge of use of DME;Cardiopulmonary status limiting activity  PT Treatment Interventions DME instruction;Gait training;Functional mobility training;Therapeutic activities;Therapeutic exercise;Patient/family education    PT Goals (Current goals can be found in the Care Plan section) Acute Rehab PT Goals Patient Stated Goal: To get stronger PT Goal Formulation: With patient Time For Goal Achievement: 08/21/14 Potential to Achieve Goals: Fair    Frequency Min 3X/week   Barriers to discharge Decreased caregiver support Patient lives alone.    Co-evaluation               End of Session Equipment Utilized During Treatment: Gait belt;Oxygen Activity Tolerance: Patient limited by fatigue (Limited by dyspnea) Patient left: in bed;with call bell/phone within reach (sitting EOB) Nurse Communication: Mobility status (Dyspnea)         Time: [id number] PT Time Calculation (min) (ACUTE ONLY): 54 min   Charges:   PT Evaluation $Initial PT Evaluation Tier I: 1 Procedure PT Treatments $Gait Training: 8-22 mins $Therapeutic Activity: 8-22 mins   PT G Codes:          Vena AustriaDavis, Virgilene Stryker H 08/14/2014, 4:08 PM Durenda HurtSusan H. Renaldo Fiddleravis, PT, [facility] LPMBA Acute Rehab Services Pager [phone]

## 2014-08-14 NOTE — Progress Notes (Signed)
Echocardiogram 2D Echocardiogram has been performed.  Alejandro Hall 08/14/2014, 10:41 AM

## 2014-08-14 NOTE — Progress Notes (Signed)
PATIENT DETAILS Name: Alejandro Hall L Rowland Age: 80 y.o. Sex: male Date of Birth: 06/30/1929 Admit Date: 08/12/2014 Admitting Physician Dewayne ShorterShanker Levora DredgeM Mir Fullilove, MD PCP:No primary care provider on file.  Subjective: Although improved, continues to have spells of bronchospasm.  Assessment/Plan: Principal Problem: Acute on chronic respiratory failure with hypoxia: Secondary to COPD exacerbation. On admission with acute resp distress-requiring BiPAP, admitted to SDU, started on Solumedrol, bronchodilators. Able to be weaned off the BiPAP and [facility]. Although improved, continues to have spells of bronchospasm, moving air-with some scattered rhonchi. Will continue with IV Solumedrol, nebs, levaquin. Await Echo. On home O2 2 L/m.   Active Problems:   COPD with exacerbation: Likely causing above. Much improved. On Home O2 2l/m.Continue solumedrol, c/w scheduled bronchodilators, empiric Levaquin.Ambulate with PT.Await Resp Virus panel.    DM-2: not on any home medications-on diet/excercise regimen, A1C 6.9-CBG's mildly elevated but patient on steroids, c/w SSI while inpatient.    HTN (hypertension):controlled with Amlodipine.  Disposition: Remain inpatient  Antibiotics:  See below   Anti-infectives    Start     Dose/Rate Route Frequency Ordered Stop   08/15/14 1500  levofloxacin (LEVAQUIN) IVPB 750 mg     750 mg100 mL/hr over 90 Minutes Intravenous Every 48 hours 08/13/14 2209     08/12/14 1500  levofloxacin (LEVAQUIN) IVPB 750 mg  Status:  Discontinued     750 mg100 mL/hr over 90 Minutes Intravenous Every 24 hours 08/12/14 1357 08/13/14 2209      DVT Prophylaxis: Prophylactic Lovenox   Code Status: Full code   Family Communication None at bedside-patient alert-understands plan of care as outlined above  Procedures:  None  CONSULTS:  None  MEDICATIONS: Scheduled Meds: . amLODipine  5 mg Oral Daily  . aspirin EC  81 mg Oral Daily  . budesonide  (PULMICORT) nebulizer solution  0.25 mg Nebulization BID  . enoxaparin (LOVENOX) injection  40 mg Subcutaneous Q24H  . insulin aspart  0-9 Units Subcutaneous TID WC  . ipratropium-albuterol  3 mL Nebulization Q6H  . [START ON 08/15/2014] levofloxacin (LEVAQUIN) IV  750 mg Intravenous Q48H  . methylPREDNISolone (SOLU-MEDROL) injection  60 mg Intravenous 3 times per day  . pantoprazole  40 mg Oral QAC breakfast  . senna  1 tablet Oral BID  . sodium chloride  3 mL Intravenous Q12H   Continuous Infusions:  PRN Meds:.acetaminophen **OR** acetaminophen, albuterol, benzonatate, guaiFENesin-dextromethorphan, hydrALAZINE, morphine injection, ondansetron **OR** ondansetron (ZOFRAN) IV, oxyCODONE    PHYSICAL EXAM: Vital signs in last 24 hours: Filed Vitals:   08/13/14 2100 08/14/14 0500 08/14/14 0823 08/14/14 0939  BP: 151/77 155/68  140/104  Pulse: 100 96  88  Temp: 98.3 F (36.8 C) 97.8 F (36.6 C)  98 F (36.7 C)  TempSrc: Oral Oral  Oral  Resp: 22 28  22   Height:      Weight: 82.736 kg (182 lb 6.4 oz)     SpO2: 94% 95% 94% 96%    Weight change: 0.936 kg (2 lb 1 oz) Filed Weights   08/12/14 0512 08/12/14 1358 08/13/14 2100  Weight: 83.915 kg (185 lb) 81.8 kg (180 lb 5.4 oz) 82.736 kg (182 lb 6.4 oz)   Body mass index is 27.74 kg/(m^2).   Gen Exam: Awake and alert with clear speech.   Neck: Supple, No JVD.   Chest: Decreased air entry at B/L bases-some scattered rhonchi CVS: S1 S2 Regular, no murmurs.  Abdomen: soft, BS +,  non tender, non distended.  Extremities: no edema, lower extremities warm to touch. Neurologic: Non Focal.   Skin: No Rash.   Wounds: N/A.   Intake/Output from previous day:  Intake/Output Summary (Last 24 hours) at 08/14/14 0947 Last data filed at 08/14/14 0900  Gross per 24 hour  Intake    580 ml  Output   1550 ml  Net   -970 ml     LAB RESULTS: CBC  Recent Labs Lab 08/12/14 0515 08/12/14 1522 08/13/14 0135  WBC 12.8* 6.8 12.9*  HGB  15.1 13.9 13.5  HCT 45.6 42.7 41.5  PLT 257 252 251  MCV 87.5 85.6 85.7  MCH 29.0 27.9 27.9  MCHC 33.1 32.6 32.5  RDW 14.8 14.9 15.1    Chemistries   Recent Labs Lab 08/12/14 0515 08/12/14 1522 08/13/14 0135  NA 138  --  135*  K 4.1  --  4.1  CL 99  --  98  CO2 22  --  21  GLUCOSE 181*  --  260*  BUN 12  --  25*  CREATININE 0.91 1.09 1.17  CALCIUM 8.5  --  8.4    CBG:  Recent Labs Lab 08/13/14 0755 08/13/14 1136 08/13/14 1642 08/13/14 2157 08/14/14 0752  GLUCAP 243* 262* 303* 197* 203*    GFR Estimated Creatinine Clearance: 50.1 mL/min (by C-G formula based on Cr of 1.17).  Coagulation profile No results for input(s): INR, PROTIME in the last 168 hours.  Cardiac Enzymes  Recent Labs Lab 08/12/14 1522 08/12/14 1954 08/13/14 0135  TROPONINI <0.30 <0.30 <0.30    Invalid input(s): POCBNP No results for input(s): DDIMER in the last 72 hours.  Recent Labs  08/12/14 1522  HGBA1C 6.9*   No results for input(s): CHOL, HDL, LDLCALC, TRIG, CHOLHDL, LDLDIRECT in the last 72 hours. No results for input(s): TSH, T4TOTAL, T3FREE, THYROIDAB in the last 72 hours.  Invalid input(s): FREET3 No results for input(s): VITAMINB12, FOLATE, FERRITIN, TIBC, IRON, RETICCTPCT in the last 72 hours. No results for input(s): LIPASE, AMYLASE in the last 72 hours.  Urine Studies No results for input(s): UHGB, CRYS in the last 72 hours.  Invalid input(s): UACOL, UAPR, USPG, UPH, UTP, UGL, UKET, UBIL, UNIT, UROB, ULEU, UEPI, UWBC, URBC, UBAC, CAST, UCOM, BILUA  MICROBIOLOGY: Recent Results (from the past 240 hour(s))  MRSA PCR Screening     Status: None   Collection Time: 08/12/14  2:20 PM  Result Value Ref Range Status   MRSA by PCR NEGATIVE NEGATIVE Final    Comment:        The GeneXpert MRSA Assay (FDA approved for NASAL specimens only), is one component of a comprehensive MRSA colonization surveillance program. It is not intended to diagnose MRSA infection nor  to guide or monitor treatment for MRSA infections.     RADIOLOGY STUDIES/RESULTS: Dg Chest Portable 1 View  08/12/2014   CLINICAL DATA:  Acute onset of shortness of breath and cough. Personal history of smoking. Initial encounter.  EXAM: PORTABLE CHEST - 1 VIEW  COMPARISON:  Chest radiograph performed 12/07/2010  FINDINGS: The lungs are hyperexpanded, likely reflecting COPD. The costophrenic angles are incompletely imaged on this study. There is no evidence of focal opacification, pleural effusion or pneumothorax.  The cardiomediastinal silhouette is borderline normal in size. No acute osseous abnormalities are seen.  IMPRESSION: No acute cardiopulmonary process seen.  Findings of COPD.   Electronically Signed   By: Roanna RaiderJeffery  Chang M.D.   On: 08/12/2014 06:27  Jeoffrey Massed, MD  [facility] Pager:[phone]  If 7PM-7AM, please contact night-coverage [email or web address] Password [facility] 08/14/2014, 9:47 AM   LOS: 2 days

## 2014-08-14 NOTE — Consult Note (Signed)
Name: Alejandro Hall MRN: [id number] DOB: 11/29/1928    ADMISSION DATE:  08/12/2014 CONSULTATION DATE:  08/14/14  REFERRING MD :  Angelica Pouriad,ghimire  CHIEF COMPLAINT:  Shortness of breath   HISTORY OF PRESENT ILLNESS:  80 year old remote ex-smoker with history of COPD on home oxygen admitted 12/19 with URI symptoms for one week followed by respiratory distress and wheezing. He required transient BiPAP on arrival to the emergency room, was treated with steroids and bronchodilators for COPD exacerbation and with empiric Levaquin. Chest x-ray showed hyperinflation without any infiltrates. ABG was 7.39/33/186, cardiac enzymes were negative. We are consulted due to persistent dyspnea. He smoked about 1-2 packs per day until he quit at age 3-about 50 pack years. He was diagnosed with OSA and used C Pap until 6 years ago and his wife died, and only uses oxygen since. [name]  STUDIES:  Echo 12/21 -normal LV function, RVSP 46 VQ scan 12/21-matched global ventilation and perfusion decrease to left lung   PAST MEDICAL HISTORY :   has a past medical history of COPD (chronic obstructive pulmonary disease) and Seizures.  has no past surgical history on file. Prior to Admission medications   Medication Sig Start Date End Date Taking? Authorizing Provider  PRESCRIPTION MEDICATION Take 1 puff by mouth daily. Inhaler   Yes Historical Provider, MD   No Known Allergies  FAMILY HISTORY:  family history is not on file. SOCIAL HISTORY:  reports that he has quit smoking. He has never used smokeless tobacco. He reports that he does not drink alcohol.  REVIEW OF SYSTEMS:   Constitutional: Negative for fever, chills, weight loss, malaise/fatigue and diaphoresis.  HENT: Negative for hearing loss, ear pain, nosebleeds, congestion, sore throat, neck pain, tinnitus and ear discharge.   Eyes: Negative for blurred vision, double vision, photophobia, pain, discharge and redness.  Respiratory:Positive  for cough,  white- sputum production, shortness of breath, wheezing  Cardiovascular: Negative for chest pain, palpitations, orthopnea, claudication, leg swelling and PND.  Gastrointestinal: Negative for heartburn, nausea, vomiting, abdominal pain, diarrhea, constipation, blood in stool and melena.  Genitourinary: Negative for dysuria, urgency, frequency, hematuria and flank pain.  Musculoskeletal: Negative for myalgias, back pain, joint pain and falls.  Skin: Negative for itching and rash.  Neurological: Negative for dizziness, tingling, tremors, sensory change, speech change, focal weakness, seizures, loss of consciousness, weakness and headaches.  Endo/Heme/Allergies: Negative for environmental allergies and polydipsia. Does not bruise/bleed easily.  SUBJECTIVE:   VITAL SIGNS: Temp:  [97.8 F (36.6 C)-98.4 F (36.9 C)] 98.4 F (36.9 C) (12/21 1714) Pulse Rate:  [86-100] 86 (12/21 1714) Resp:  [22-28] 22 (12/21 1714) BP: (140-155)/(68-104) 140/74 mmHg (12/21 1714) SpO2:  [94 %-98 %] 98 % (12/21 1714) Weight:  [82.736 kg (182 lb 6.4 oz)] 82.736 kg (182 lb 6.4 oz) (12/20 2100)  PHYSICAL EXAMINATION: General:  Well-nourished , in no distress , able to ambulate from bathroom to bed  Neuro:  Awake alert nonfocal  HEENT:  Class II airway , no JVD  Cardiovascular:  S1-S2 normal , no murmur  Lungs:  Decreased breath sounds bilateral , faint end expiratory wheeze  Abdomen:  Soft nontender  Musculoskeletal:  No edema  Skin:  Good cap refill , no rash   Recent Labs Lab 08/12/14 0515 08/12/14 1522 08/13/14 0135  NA 138  --  135*  K 4.1  --  4.1  CL 99  --  98  CO2 22  --  21  BUN 12  --  25*  CREATININE 0.91 1.09 1.17  GLUCOSE 181*  --  260*    Recent Labs Lab 08/12/14 0515 08/12/14 1522 08/13/14 0135  HGB 15.1 13.9 13.5  HCT 45.6 42.7 41.5  WBC 12.8* 6.8 12.9*  PLT 257 252 251   Nm Pulmonary Perf And Vent  08/14/2014   CLINICAL DATA:  Shortness of breath for a week, COPD  EXAM:  NUCLEAR MEDICINE VENTILATION - PERFUSION LUNG SCAN  TECHNIQUE: Ventilation images were obtained in multiple projections using inhaled aerosol technetium 99 M DTPA. Perfusion images were obtained in multiple projections after intravenous injection of Tc-6745m MAA.  RADIOPHARMACEUTICALS:  40 mCi Tc-8345m DTPA aerosol and 6mCi Tc-6445m MAA  COMPARISON:  None.  FINDINGS: Ventilation: Heterogeneous ventilation with central deposition of radiotracer as can be seen with lower airways disease. There is asymmetric decreased ventilation of the left lung.  Perfusion: No wedge shaped peripheral perfusion defects to suggest acute pulmonary embolism. There is global decreased profusion to the left lung.  IMPRESSION: 1. No scintigraphic evidence of pulmonary embolus. 2. Matched global decreased ventilation and profusion to the left lung. This appearance can be seen with a centrally obstructing mass or mucus plugging. Recommend further evaluation with a CT of the chest.   Electronically Signed   By: Elige KoHetal  Patel   On: 08/14/2014 15:02    ASSESSMENT / PLAN:  COPD exacerbation  Mild pulmonary hypertension  OSA   Fortunately for him, V/Q scan does not suggest chronic PE. Although RVSP is only 46, dilated RV makes me wonder if this is higher. I doubt that given his advanced age, we will pursue a right heart catheterization. Recommend - Continue steroids and bronchodilators I have asked him to resume using his CPap after discharge Gentle diuresis, especially if BNP is high or if he develops pedal edema. Stay on oxygen He will need follow-up at the pulmonary clinic on discharge   Cyril Mourningakesh Cathy Ropp MD. FCCP. [facility] Pager [phone] If no response call [phone]    08/14/2014, 5:42 PM

## 2014-08-15 DIAGNOSIS — J441 Chronic obstructive pulmonary disease with (acute) exacerbation: Secondary | ICD-10-CM | POA: Insufficient documentation

## 2014-08-15 DIAGNOSIS — I272 Pulmonary hypertension, unspecified: Secondary | ICD-10-CM | POA: Insufficient documentation

## 2014-08-15 LAB — RESPIRATORY VIRUS PANEL
ADENOVIRUS: NOT DETECTED
INFLUENZA A: NOT DETECTED
Influenza A H1: NOT DETECTED
Influenza A H3: NOT DETECTED
Influenza B: NOT DETECTED
Metapneumovirus: NOT DETECTED
PARAINFLUENZA 1 A: NOT DETECTED
PARAINFLUENZA 2 A: NOT DETECTED
PARAINFLUENZA 3 A: NOT DETECTED
RESPIRATORY SYNCYTIAL VIRUS B: NOT DETECTED
Respiratory Syncytial Virus A: DETECTED — AB
Rhinovirus: NOT DETECTED

## 2014-08-15 LAB — GLUCOSE, CAPILLARY
GLUCOSE-CAPILLARY: 384 mg/dL — AB (ref 70–99)
Glucose-Capillary: 224 mg/dL — ABNORMAL HIGH (ref 70–99)
Glucose-Capillary: 420 mg/dL — ABNORMAL HIGH (ref 70–99)
Glucose-Capillary: 195 mg/dL — ABNORMAL HIGH (ref 70–99)

## 2014-08-15 LAB — GLUCOSE, RANDOM: GLUCOSE: 237 mg/dL — AB (ref 70–99)

## 2014-08-15 MED ORDER — FUROSEMIDE 10 MG/ML IJ SOLN
20.0000 mg | Freq: Every day | INTRAMUSCULAR | Status: AC
  Administered 2014-08-15 – 2014-08-16 (×2): 20 mg via INTRAVENOUS
  Filled 2014-08-15 (×2): qty 2

## 2014-08-15 MED ORDER — INSULIN ASPART 100 UNIT/ML ~~LOC~~ SOLN
25.0000 [IU] | Freq: Once | SUBCUTANEOUS | Status: AC
  Administered 2014-08-15: 25 [IU] via SUBCUTANEOUS

## 2014-08-15 MED ORDER — INSULIN ASPART 100 UNIT/ML ~~LOC~~ SOLN
0.0000 [IU] | Freq: Three times a day (TID) | SUBCUTANEOUS | Status: DC
  Administered 2014-08-16: 4 [IU] via SUBCUTANEOUS
  Administered 2014-08-16 – 2014-08-17 (×3): 7 [IU] via SUBCUTANEOUS

## 2014-08-15 MED ORDER — POTASSIUM CHLORIDE CRYS ER 20 MEQ PO TBCR
20.0000 meq | EXTENDED_RELEASE_TABLET | Freq: Every day | ORAL | Status: AC
  Administered 2014-08-15 – 2014-08-16 (×2): 20 meq via ORAL
  Filled 2014-08-15 (×2): qty 1

## 2014-08-15 MED ORDER — METHYLPREDNISOLONE SODIUM SUCC 40 MG IJ SOLR
40.0000 mg | Freq: Three times a day (TID) | INTRAMUSCULAR | Status: DC
  Administered 2014-08-15 – 2014-08-17 (×6): 40 mg via INTRAVENOUS
  Filled 2014-08-15 (×9): qty 1

## 2014-08-15 MED ORDER — INSULIN GLARGINE 100 UNIT/ML ~~LOC~~ SOLN
15.0000 [IU] | Freq: Every day | SUBCUTANEOUS | Status: DC
  Administered 2014-08-15 – 2014-08-16 (×2): 15 [IU] via SUBCUTANEOUS
  Filled 2014-08-15 (×3): qty 0.15

## 2014-08-15 NOTE — Care Management Note (Signed)
CARE MANAGEMENT NOTE 08/15/2014  Patient:  Alejandro Hall, Alejandro Hall   Account Number:  [id number]  Date Initiated:  08/15/2014  Documentation initiated by:  Nicholle Falzon  Subjective/Objective Assessment:   CM following for progression and d/c planning.     Action/Plan:   08/15/2014 Met with pt and IM given noted PT recommending SNF. CSW aware and following.   Anticipated DC Date:  08/17/2014   Anticipated DC Plan:  SKILLED NURSING FACILITY         Choice offered to / List presented to:             Status of service:  Completed, signed off Medicare Important Message given?  YES (If response is "NO", the following Medicare IM given date fields will be blank) Date Medicare IM given:  08/15/2014 Medicare IM given by:  Reet Scharrer Date Additional Medicare IM given:   Additional Medicare IM given by:    Discharge Disposition:  [place]  Per UR Regulation:    If discussed at Long Length of Stay Meetings, dates discussed:    Comments:

## 2014-08-15 NOTE — Progress Notes (Signed)
PATIENT DETAILS Name: Alejandro Hall Age: 80 y.o. Sex: male Date of Birth: 1929-03-10 Admit Date: 08/12/2014 Admitting Physician Evalee Mutton Kristeen Mans, MD PCP:No primary care provider on file.  Brief summary:   Patient is a 80 year old male with a history of COPD on home O2 admitted on 12/19 for acute respiratory failure secondary to COPD exacerbation.Patient was initially admitted to the step down unit as he required BiPAP support, with clinical improvement he was weaned off the BiPAP, doing well but slowly improving.  echocardiogram showed normal left ventricle function, however showed mild-to-moderate pulmonary hypertension and significantly dilated right ventricle. Patient underwent a VQ scan to rule out chronic PE which was negative. Current plans are to continue with current treatment, may require SNF on discharge.   Subjective: Although improved, continues to have spells of bronchospasm.  Assessment/Plan: Principal Problem: Acute on chronic respiratory failure with hypoxia: Secondary to COPD exacerbation. On admission with acute resp distress-requiring BiPAP, admitted to SDU, started on Solumedrol, bronchodilators. Able to be weaned off the BiPAP and transferred to [facility] on 12/21. Although improved, continues to have spells of bronchospasm, moving air-with some scattered rhonchi. Will continue with IV Solumedrol but taper to 40 mg TID, nebs, levaquin. Await Echo. On home O2 2 L/m. Echocardiogram showed normal EF-showed moderate pulmonary hypertension with right ventricular dilatation. VQ scan negative for chronic pulmonary embolism. Seen by PCCM, not a candidate for further workup including right heart catheterization.   Active Problems:   COPD with exacerbation: Likely causing above. Much improved but still not back to baseline. On Home O2 2l/m.Continue solumedrol, c/w scheduled bronchodilators, empiric Levaquin.Ambulate with PT. Resp Virus panel shows RSV.    DM-2: not on any home  medications-on diet/excercise regimen, A1C 6.9-CBG's elevated but patient on steroids, c/w SSI while inpatient.    HTN (hypertension):controlled with Amlodipine.  Disposition: Remain inpatient-SNF vs Home health services on discharge  Antibiotics:  See below   Anti-infectives    Start     Dose/Rate Route Frequency Ordered Stop   08/15/14 1500  levofloxacin (LEVAQUIN) IVPB 750 mg  Status:  Discontinued     750 mg100 mL/hr over 90 Minutes Intravenous Every 48 hours 08/13/14 2209 08/14/14 0956   08/14/14 1400  levofloxacin (LEVAQUIN) tablet 750 mg     750 mg Oral Daily 08/14/14 0956     08/12/14 1500  levofloxacin (LEVAQUIN) IVPB 750 mg  Status:  Discontinued     750 mg100 mL/hr over 90 Minutes Intravenous Every 24 hours 08/12/14 1357 08/13/14 2209      DVT Prophylaxis: Prophylactic Lovenox   Code Status: Full code   Family Communication None at bedside-patient alert-understands plan of care as outlined above  Procedures:  None  CONSULTS:  None  MEDICATIONS: Scheduled Meds: . amLODipine  5 mg Oral Daily  . aspirin EC  81 mg Oral Daily  . budesonide (PULMICORT) nebulizer solution  0.25 mg Nebulization BID  . enoxaparin (LOVENOX) injection  40 mg Subcutaneous Q24H  . insulin aspart  0-9 Units Subcutaneous TID WC  . ipratropium-albuterol  3 mL Nebulization TID  . levofloxacin  750 mg Oral Daily  . methylPREDNISolone (SOLU-MEDROL) injection  40 mg Intravenous 3 times per day  . pantoprazole  40 mg Oral QAC breakfast  . senna  1 tablet Oral BID  . sodium chloride  3 mL Intravenous Q12H   Continuous Infusions:  PRN Meds:.acetaminophen **OR** acetaminophen, albuterol, benzonatate, guaiFENesin-dextromethorphan, hydrALAZINE, morphine injection, ondansetron **OR** ondansetron (ZOFRAN) IV,  oxyCODONE    PHYSICAL EXAM: Vital signs in last 24 hours: Filed Vitals:   08/14/14 1953 08/14/14 2030 08/15/14 0422 08/15/14 1023  BP:  132/61 156/74 183/99  Pulse:  80 91 100    Temp:  97.8 F (36.6 C) 98.4 F (36.9 C) 97.8 F (36.6 C)  TempSrc:  Oral Oral Oral  Resp:  $Remo'21 21 20  'gcfKw$ Height:      Weight:  82.736 kg (182 lb 6.4 oz)    SpO2: 98% 93% 94% 98%    Weight change: 0 kg (0 lb) Filed Weights   08/12/14 1358 08/13/14 2100 08/14/14 2030  Weight: 81.8 kg (180 lb 5.4 oz) 82.736 kg (182 lb 6.4 oz) 82.736 kg (182 lb 6.4 oz)   Body mass index is 27.74 kg/(m^2).   Gen Exam: Awake and alert with clear speech.   Neck: Supple, No JVD.   Chest: Decreased air entry at B/L bases-some scattered rhonchi present CVS: S1 S2 Regular, no murmurs.  Abdomen: soft, BS +, non tender, non distended.  Extremities: no edema, lower extremities warm to touch. Neurologic: Non Focal.   Skin: No Rash.   Wounds: N/A.   Intake/Output from previous day:  Intake/Output Summary (Last 24 hours) at 08/15/14 1031 Last data filed at 08/15/14 1023  Gross per 24 hour  Intake   1020 ml  Output   1775 ml  Net   -755 ml     LAB RESULTS: CBC  Recent Labs Lab 08/12/14 0515 08/12/14 1522 08/13/14 0135  WBC 12.8* 6.8 12.9*  HGB 15.1 13.9 13.5  HCT 45.6 42.7 41.5  PLT 257 252 251  MCV 87.5 85.6 85.7  MCH 29.0 27.9 27.9  MCHC 33.1 32.6 32.5  RDW 14.8 14.9 15.1    Chemistries   Recent Labs Lab 08/12/14 0515 08/12/14 1522 08/13/14 0135  NA 138  --  135*  K 4.1  --  4.1  CL 99  --  98  CO2 22  --  21  GLUCOSE 181*  --  260*  BUN 12  --  25*  CREATININE 0.91 1.09 1.17  CALCIUM 8.5  --  8.4    CBG:  Recent Labs Lab 08/13/14 2157 08/14/14 0752 08/14/14 1143 08/14/14 1658 08/15/14 0743  GLUCAP 197* 203* 348* 282* 224*    GFR Estimated Creatinine Clearance: 50.1 mL/min (by C-G formula based on Cr of 1.17).  Coagulation profile No results for input(s): INR, PROTIME in the last 168 hours.  Cardiac Enzymes  Recent Labs Lab 08/12/14 1522 08/12/14 1954 08/13/14 0135  TROPONINI <0.30 <0.30 <0.30    Invalid input(s): POCBNP No results for input(s):  DDIMER in the last 72 hours.  Recent Labs  08/12/14 1522  HGBA1C 6.9*   No results for input(s): CHOL, HDL, LDLCALC, TRIG, CHOLHDL, LDLDIRECT in the last 72 hours. No results for input(s): TSH, T4TOTAL, T3FREE, THYROIDAB in the last 72 hours.  Invalid input(s): FREET3 No results for input(s): VITAMINB12, FOLATE, FERRITIN, TIBC, IRON, RETICCTPCT in the last 72 hours. No results for input(s): LIPASE, AMYLASE in the last 72 hours.  Urine Studies No results for input(s): UHGB, CRYS in the last 72 hours.  Invalid input(s): UACOL, UAPR, USPG, UPH, UTP, UGL, UKET, UBIL, UNIT, UROB, ULEU, UEPI, UWBC, URBC, UBAC, CAST, UCOM, BILUA  MICROBIOLOGY: Recent Results (from the past 240 hour(s))  MRSA PCR Screening     Status: None   Collection Time: 08/12/14  2:20 PM  Result Value Ref Range Status   MRSA  by PCR NEGATIVE NEGATIVE Final    Comment:        The GeneXpert MRSA Assay (FDA approved for NASAL specimens only), is one component of a comprehensive MRSA colonization surveillance program. It is not intended to diagnose MRSA infection nor to guide or monitor treatment for MRSA infections.   Respiratory virus panel (routine influenza)     Status: Abnormal   Collection Time: 08/12/14  5:52 PM  Result Value Ref Range Status   Source - RVPAN NASOPHARYNGEAL  Final   Respiratory Syncytial Virus A DETECTED (A)  Final   Respiratory Syncytial Virus B NOT DETECTED  Final   Influenza A NOT DETECTED  Final   Influenza B NOT DETECTED  Final   Parainfluenza 1 NOT DETECTED  Final   Parainfluenza 2 NOT DETECTED  Final   Parainfluenza 3 NOT DETECTED  Final   Metapneumovirus NOT DETECTED  Final   Rhinovirus NOT DETECTED  Final   Adenovirus NOT DETECTED  Final   Influenza A H1 NOT DETECTED  Final   Influenza A H3 NOT DETECTED  Final    Comment: (NOTE)       Normal Reference Range for each Analyte: NOT DETECTED Testing performed using the Luminex xTAG Respiratory Viral Panel test kit. The  analytical performance characteristics of this assay have been determined by [employer].  The modifications have not been cleared or approved by the FDA. This assay has been validated pursuant to the CLIA regulations and is used for clinical purposes. Performed at [facility] STUDIES/RESULTS: Nm Pulmonary Perf And Vent  08/14/2014   CLINICAL DATA:  Shortness of breath for a week, COPD  EXAM: NUCLEAR MEDICINE VENTILATION - PERFUSION LUNG SCAN  TECHNIQUE: Ventilation images were obtained in multiple projections using inhaled aerosol technetium 99 M DTPA. Perfusion images were obtained in multiple projections after intravenous injection of Tc-4m MAA.  RADIOPHARMACEUTICALS:  40 mCi Tc-8m DTPA aerosol and 14mCi Tc-53m MAA  COMPARISON:  None.  FINDINGS: Ventilation: Heterogeneous ventilation with central deposition of radiotracer as can be seen with lower airways disease. There is asymmetric decreased ventilation of the left lung.  Perfusion: No wedge shaped peripheral perfusion defects to suggest acute pulmonary embolism. There is global decreased profusion to the left lung.  IMPRESSION: 1. No scintigraphic evidence of pulmonary embolus. 2. Matched global decreased ventilation and profusion to the left lung. This appearance can be seen with a centrally obstructing mass or mucus plugging. Recommend further evaluation with a CT of the chest.   Electronically Signed   By: Kathreen Devoid   On: 08/14/2014 15:02   Dg Chest Portable 1 View  08/12/2014   CLINICAL DATA:  Acute onset of shortness of breath and cough. Personal history of smoking. Initial encounter.  EXAM: PORTABLE CHEST - 1 VIEW  COMPARISON:  Chest radiograph performed 12/07/2010  FINDINGS: The lungs are hyperexpanded, likely reflecting COPD. The costophrenic angles are incompletely imaged on this study. There is no evidence of focal opacification, pleural effusion or pneumothorax.  The cardiomediastinal silhouette is  borderline normal in size. No acute osseous abnormalities are seen.  IMPRESSION: No acute cardiopulmonary process seen.  Findings of COPD.   Electronically Signed   By: Garald Balding M.D.   On: 08/12/2014 06:27    Oren Binet, MD  [facility] Pager:[phone]  If 7PM-7AM, please contact night-coverage [email or web address] Password [facility] 08/15/2014, 10:31 AM   LOS: 3 days

## 2014-08-15 NOTE — Clinical Social Work Psychosocial (Signed)
Clinical Social Work Department BRIEF PSYCHOSOCIAL ASSESSMENT 08/15/2014  Patient:  Alejandro Hall,Alejandro Hall     Account Number:  [id number]     Admit date:  08/12/2014  Clinical Social Worker:  Delmer IslamRAWFORD,Michaelpaul Apo, LCSW  Date/Time:  08/15/2014 05:07 AM  Referred by:  Physician  Date Referred:  08/14/2014 Referred for  SNF Placement   Other Referral:   Interview type:  Patient Other interview type:   CSW later spoke with patient's son Alejandro Hall at the bedside.    PSYCHOSOCIAL DATA Living Status:  ALONE Admitted from facility:   Level of care:   Primary support name:  Alejandro Hall Primary support relationship to patient:  CHILD, ADULT Degree of support available:   Good support from son per patient.    CURRENT CONCERNS Current Concerns  Post-Acute Placement   Other Concerns:    SOCIAL WORK ASSESSMENT / PLAN CSW initially talked with patient regarding MD/PT recommendation of ST rehab and provided clarity regarding where ST rehab is housed, how often they would work with him and answered his questions. Mr. Phillips Hall realizes that he will need help initially when going home but wanted to talk with his son Alejandro Hall (age 78) regarding possibility staying with him when he gets out of the hospital. Patient reported that he also has 3 daughters and grandchildren, but are estranged from them.    Later same date CSW contacted by patient that son was in room. CSW spoke with Alejandro Hall, patient's son, as well as Alejandro Hall regarding discharge plans for patient. Alejandro Hall informed CSW that he will stay with patient for 2/3 weeks to assure his safety. Abby added that they do assist patient with grocery shopping and other errands and can help if needed with meal preparation. Patient also reported that he cooks for himself.  Patient reported that he really wants to go home as all of his equipment that assists him with breathing is at home. CSW informed patient that nurse case  manager will talk with him about home heath services.   Assessment/plan status:  No Further Intervention Required Other assessment/ plan:   Information/referral to community resources:   None needed or requested at this time.    PATIENT'S/FAMILY'S RESPONSE TO PLAN OF CARE: Patient very pleasant and talkative. He wants to go home and son Alejandro Hall will stay with him. CSW signing off as patient will discharge home once medically stable with home health services and assistance from his son.     Genelle BalVanessa Unknown Schleyer, MSW, LCSW Licensed Clinical Social Worker Clinical Social Work Department [facility] [phone]

## 2014-08-15 NOTE — Progress Notes (Signed)
Physical Therapy Treatment Patient Details Name: Alejandro Hall L Barkey MRN: [id number] DOB: 07/15/1929 Today's Date: 08/15/2014    History of Present Illness Patient is an 80 yo male admitte 08/12/14 with worsening SOB.  Patient with acute resp failure with hypoxia, COPD exacerbation.  PMH:  COPD on home O2, DM, HTN, seizures.    PT Comments    Pt progressing towards physical therapy goals. At rest, O2 sats at 87% on 2L/min supplemental O2. Was increased to 2.5L/min for ambulation in room. Sats dropped to 88% but increased to >90% with seated rest break. Pt was cued for pursed-lip breathing throughout session. Incentive spirometer given and pt was instructed in technique and frequency of use. Will continue to follow.   Follow Up Recommendations  SNF;Supervision/Assistance - 24 hour     Equipment Recommendations  None recommended by PT    Recommendations for Other Services       Precautions / Restrictions Precautions Precautions: Fall Precaution Comments: Home O2 Restrictions Weight Bearing Restrictions: No    Mobility  Bed Mobility Overal bed mobility: Modified Independent             General bed mobility comments: Use of bed rail to move to sitting  Transfers Overall transfer level: Needs assistance Equipment used: None Transfers: Sit to/from Stand Sit to Stand: Supervision         General transfer comment: Pt able to power-up to standing without assistance. Pt moving slowly and was cued for pursed-lip breathing to maintain O2 sats.  Ambulation/Gait Ambulation/Gait assistance: Min guard Ambulation Distance (Feet): 15 Feet Assistive device: None Gait Pattern/deviations: Step-through pattern;Decreased stride length;Trendelenburg Gait velocity: Decreased Gait velocity interpretation: Below normal speed for age/gender General Gait Details: Pt reaching for furniture to steady self while ambulating in room. Was able to walk around the bed to the chair. Throughout gait  training pt was cued for pursed-lip breathing and sats dropped to 88% on 2.5L/min supplemental O2.    Stairs            Wheelchair Mobility    Modified Rankin (Stroke Patients Only)       Balance Overall balance assessment: Needs assistance Sitting-balance support: Feet supported;No upper extremity supported Sitting balance-Leahy Scale: Good     Standing balance support: No upper extremity supported Standing balance-Leahy Scale: Fair                      Cognition Arousal/Alertness: Awake/alert Behavior During Therapy: WFL for tasks assessed/performed Overall Cognitive Status: Within Functional Limits for tasks assessed                      Exercises General Exercises - Lower Extremity Long Arc Quad: 15 reps    General Comments General comments (skin integrity, edema, etc.): Pt was given incentive spirometer and instructed in use.       Pertinent Vitals/Pain Pain Assessment: No/denies pain    Home Living                      Prior Function            PT Goals (current goals can now be found in the care plan section) Acute Rehab PT Goals Patient Stated Goal: To get stronger PT Goal Formulation: With patient Time For Goal Achievement: 08/21/14 Potential to Achieve Goals: Fair Progress towards PT goals: Progressing toward goals    Frequency  Min 3X/week    PT Plan Current plan remains appropriate  Co-evaluation             End of Session Equipment Utilized During Treatment: Gait belt;Oxygen Activity Tolerance: Patient limited by fatigue (DOE) Patient left: in chair;with call bell/phone within reach     Time: 0934-1000 PT Time Calculation (min) (ACUTE ONLY): 26 min  Charges:  $Gait Training: 8-22 mins $Therapeutic Activity: 8-22 mins                    G Codes:      Conni SlipperKirkman, Daylan Juhnke 08/15/2014, 10:11 AM   Conni SlipperLaura Josalynn Johndrow, PT, DPT Acute Rehabilitation Services Pager: [phone]

## 2014-08-15 NOTE — Progress Notes (Signed)
Name: Alejandro Hall MRN: [id number] DOB: 11/22/1928    ADMISSION DATE:  08/12/2014 CONSULTATION DATE:  08/14/14  REFERRING MD :  Angelica Pouriad,ghimire  CHIEF COMPLAINT:  Shortness of breath   HISTORY OF PRESENT ILLNESS:  80 year old remote ex-smoker with history of COPD on home oxygen admitted 12/19 with URI symptoms for one week followed by respiratory distress and wheezing. He required transient BiPAP on arrival to the emergency room, was treated with steroids and bronchodilators for COPD exacerbation and with empiric Levaquin. Chest x-ray showed hyperinflation without any infiltrates. ABG was 7.39/33/186, cardiac enzymes were negative. We are consulted due to persistent dyspnea. He smoked about 1-2 packs per day until he quit at age 69-about 50 pack years. He was diagnosed with OSA and used C Pap until 6 years ago and his wife died, and only uses oxygen since. [name]  STUDIES:  Echo 12/21 -normal LV function, RVSP 46 VQ scan 12/21-matched global ventilation and perfusion decrease to left lung  SUBJECTIVE: Feels slightly better today, although still reports severe dyspnea with activity. At baseline he can walk from house 25-30 to trash cans and back before having to rest, now he is only able to go from bed to chair.    VITAL SIGNS: Temp:  [97.8 F (36.6 C)-98.4 F (36.9 C)] 97.8 F (36.6 C) (12/22 1023) Pulse Rate:  [80-100] 100 (12/22 1023) Resp:  [20-22] 20 (12/22 1023) BP: (132-183)/(61-99) 183/99 mmHg (12/22 1023) SpO2:  [93 %-98 %] 98 % (12/22 1023) Weight:  [82.736 kg (182 lb 6.4 oz)] 82.736 kg (182 lb 6.4 oz) (12/21 2030)  PHYSICAL EXAMINATION: General:  Well-nourished , increased WOB, just transferred to chair Neuro:  Awake alert nonfocal  HEENT:  Class II airway , no JVD  Cardiovascular:  S1-S2 normal , no murmur  Lungs:  Decreased breath sounds bilateral , coarse breath sounds bilaterally.  Abdomen:  Soft nontender  Musculoskeletal:  No edema  Skin:  Good cap refill  , no rash   Recent Labs Lab 08/12/14 0515 08/12/14 1522 08/13/14 0135  NA 138  --  135*  K 4.1  --  4.1  CL 99  --  98  CO2 22  --  21  BUN 12  --  25*  CREATININE 0.91 1.09 1.17  GLUCOSE 181*  --  260*    Recent Labs Lab 08/12/14 0515 08/12/14 1522 08/13/14 0135  HGB 15.1 13.9 13.5  HCT 45.6 42.7 41.5  WBC 12.8* 6.8 12.9*  PLT 257 252 251   Nm Pulmonary Perf And Vent  08/14/2014   CLINICAL DATA:  Shortness of breath for a week, COPD  EXAM: NUCLEAR MEDICINE VENTILATION - PERFUSION LUNG SCAN  TECHNIQUE: Ventilation images were obtained in multiple projections using inhaled aerosol technetium 99 M DTPA. Perfusion images were obtained in multiple projections after intravenous injection of Tc-3147m MAA.  RADIOPHARMACEUTICALS:  40 mCi Tc-6947m DTPA aerosol and 6mCi Tc-5747m MAA  COMPARISON:  None.  FINDINGS: Ventilation: Heterogeneous ventilation with central deposition of radiotracer as can be seen with lower airways disease. There is asymmetric decreased ventilation of the left lung.  Perfusion: No wedge shaped peripheral perfusion defects to suggest acute pulmonary embolism. There is global decreased profusion to the left lung.  IMPRESSION: 1. No scintigraphic evidence of pulmonary embolus. 2. Matched global decreased ventilation and profusion to the left lung. This appearance can be seen with a centrally obstructing mass or mucus plugging. Recommend further evaluation with a CT of the chest.   Electronically Signed  By: Elige KoHetal  Patel   On: 08/14/2014 15:02    ASSESSMENT / PLAN:  COPD exacerbation  Mild pulmonary hypertension  OSA   Fortunately for him, V/Q scan does not suggest chronic PE. Although RVSP is only 46, dilated RV makes me wonder if this is higher. I doubt that given his advanced age, we will pursue a right heart catheterization. BNP mildly elevated.   Recommend - Continue steroids -taper over 2 weeks  Continue  bronchodilators I have asked him to resume using his CPap  after discharge Lasix 20mg  daily for 2 days, sup K as well.  Stay on oxygen (on 2L at home) He will need follow-up at the pulmonary clinic on discharge (pl call [phone] to arrange Ov with Tammy Parrett on dc)   Joneen RoachPaul Hoffman, ACNP [place] [facility] Pager [phone] or [phone]   Care during the described time interval was provided by me and/or other providers on the critical care team.  I have reviewed this patient's available data, including medical history, events of note, physical examination and test results as part of my evaluation  Oretha MilchALVA,RAKESH V. MD

## 2014-08-16 LAB — CBC
HCT: 42.5 % (ref 39.0–52.0)
Hemoglobin: 13.8 g/dL (ref 13.0–17.0)
MCH: 27.9 pg (ref 26.0–34.0)
MCHC: 32.5 g/dL (ref 30.0–36.0)
MCV: 85.9 fL (ref 78.0–100.0)
Platelets: 270 10*3/uL (ref 150–400)
RBC: 4.95 MIL/uL (ref 4.22–5.81)
RDW: 14.9 % (ref 11.5–15.5)
WBC: 11.2 10*3/uL — ABNORMAL HIGH (ref 4.0–10.5)

## 2014-08-16 LAB — GLUCOSE, CAPILLARY
GLUCOSE-CAPILLARY: 217 mg/dL — AB (ref 70–99)
GLUCOSE-CAPILLARY: 217 mg/dL — AB (ref 70–99)
Glucose-Capillary: 186 mg/dL — ABNORMAL HIGH (ref 70–99)
Glucose-Capillary: 228 mg/dL — ABNORMAL HIGH (ref 70–99)

## 2014-08-16 LAB — BASIC METABOLIC PANEL
ANION GAP: 8 (ref 5–15)
BUN: 28 mg/dL — ABNORMAL HIGH (ref 6–23)
CHLORIDE: 101 meq/L (ref 96–112)
CO2: 29 mmol/L (ref 19–32)
Calcium: 8.3 mg/dL — ABNORMAL LOW (ref 8.4–10.5)
Creatinine, Ser: 1.2 mg/dL (ref 0.50–1.35)
GFR calc Af Amer: 63 mL/min — ABNORMAL LOW (ref >90)
GFR calc non Af Amer: 54 mL/min — ABNORMAL LOW (ref >90)
Glucose, Bld: 210 mg/dL — ABNORMAL HIGH (ref 70–99)
Potassium: 4.3 mmol/L (ref 3.5–5.1)
Sodium: 138 mmol/L (ref 135–145)

## 2014-08-16 NOTE — Care Management Note (Signed)
Met with pt who is O2 dep at home with oxygen from [facility]. He selected [facility] for [facility] services, await orders. Pt family will provide 24/7 assistance in the home.  CRoyal RN MPH, case manager, [phone]

## 2014-08-16 NOTE — Progress Notes (Signed)
[facility] PROGRESS NOTE  Alejandro Hall EYC:[id number] DOB: Nov 29, 1928 DOA: 08/12/2014 PCP: No primary care provider on file.  Assessment/Plan: Acute on chronic respiratory failure with hypoxia:  - VQ scan negative for PE - Pt condition improving on current regimen.  - Will continue and reassess nexxt am  Active Problems:  COPD with exacerbation:  - On Solumedrol, Duoneb, Levaquin, and pulmicort. - improving on this regimen.   DM-2:  - Continue diabetic diet - SSI - Suspect elevated blood sugars secondary to prednisone use   HTN (hypertension): -  BP's fluctuating while on Amlodipine. Should the blood pressures remain persistently elevated will plan on increasing amlodipine dose.  Code Status: full Family Communication: no family at bedside Disposition Plan: Pending improvement in respiratory condition.   Consultants:  None  Procedures:  None  Antibiotics:  Levaquin  HPI/Subjective: Patient reports his breathing condition is much improved. Not quite at baseline but getting there  Objective: Filed Vitals:   08/16/14 0932  BP: 162/99  Pulse: 89  Temp: 97.8 F (36.6 C)  Resp: 20    Intake/Output Summary (Last 24 hours) at 08/16/14 1139 Last data filed at 08/16/14 0900  Gross per 24 hour  Intake    900 ml  Output   2600 ml  Net  -1700 ml   Filed Weights   08/12/14 1358 08/13/14 2100 08/14/14 2030  Weight: 81.8 kg (180 lb 5.4 oz) 82.736 kg (182 lb 6.4 oz) 82.736 kg (182 lb 6.4 oz)    Exam:   General:  Patient in no acute distress, alert and awake  Cardiovascular: Regular rate and rhythm, no rubs  Respiratory: Prolonged expiratory phase, mild wheezing bilaterally, breath sounds auscultated bilaterally  Abdomen: Soft, nondistended, nontender  Musculoskeletal: No cyanosis or clubbing   Data Reviewed: Basic Metabolic Panel:  Recent Labs Lab 08/12/14 0515 08/12/14 1522 08/13/14 0135 08/15/14 1944 08/16/14 0500  NA 138  --  135*   --  138  K 4.1  --  4.1  --  4.3  CL 99  --  98  --  101  CO2 22  --  21  --  29  GLUCOSE 181*  --  260* 237* 210*  BUN 12  --  25*  --  28*  CREATININE 0.91 1.09 1.17  --  1.20  CALCIUM 8.5  --  8.4  --  8.3*   Liver Function Tests: No results for input(s): AST, ALT, ALKPHOS, BILITOT, PROT, ALBUMIN in the last 168 hours. No results for input(s): LIPASE, AMYLASE in the last 168 hours. No results for input(s): AMMONIA in the last 168 hours. CBC:  Recent Labs Lab 08/12/14 0515 08/12/14 1522 08/13/14 0135 08/16/14 0500  WBC 12.8* 6.8 12.9* 11.2*  HGB 15.1 13.9 13.5 13.8  HCT 45.6 42.7 41.5 42.5  MCV 87.5 85.6 85.7 85.9  PLT 257 252 251 270   Cardiac Enzymes:  Recent Labs Lab 08/12/14 1522 08/12/14 1954 08/13/14 0135  TROPONINI <0.30 <0.30 <0.30   BNP (last 3 results)  Recent Labs  08/14/14 2015  PROBNP 704.7*   CBG:  Recent Labs Lab 08/15/14 1150 08/15/14 1641 08/15/14 2033 08/16/14 0749 08/16/14 1125  GLUCAP 384* 420* 195* 186* 217*    Recent Results (from the past 240 hour(s))  MRSA PCR Screening     Status: None   Collection Time: 08/12/14  2:20 PM  Result Value Ref Range Status   MRSA by PCR NEGATIVE NEGATIVE Final    Comment:  The GeneXpert MRSA Assay (FDA approved for NASAL specimens only), is one component of a comprehensive MRSA colonization surveillance program. It is not intended to diagnose MRSA infection nor to guide or monitor treatment for MRSA infections.   Respiratory virus panel (routine influenza)     Status: Abnormal   Collection Time: 08/12/14  5:52 PM  Result Value Ref Range Status   Source - RVPAN NASOPHARYNGEAL  Final   Respiratory Syncytial Virus A DETECTED (A)  Final   Respiratory Syncytial Virus B NOT DETECTED  Final   Influenza A NOT DETECTED  Final   Influenza B NOT DETECTED  Final   Parainfluenza 1 NOT DETECTED  Final   Parainfluenza 2 NOT DETECTED  Final   Parainfluenza 3 NOT DETECTED  Final    Metapneumovirus NOT DETECTED  Final   Rhinovirus NOT DETECTED  Final   Adenovirus NOT DETECTED  Final   Influenza A H1 NOT DETECTED  Final   Influenza A H3 NOT DETECTED  Final    Comment: (NOTE)       Normal Reference Range for each Analyte: NOT DETECTED Testing performed using the Luminex xTAG Respiratory Viral Panel test kit. The analytical performance characteristics of this assay have been determined by [employer].  The modifications have not been cleared or approved by the FDA. This assay has been validated pursuant to the CLIA regulations and is used for clinical purposes. Performed at [facility]      Studies: Nm Pulmonary Perf And Vent  08/14/2014   CLINICAL DATA:  Shortness of breath for a week, COPD  EXAM: NUCLEAR MEDICINE VENTILATION - PERFUSION LUNG SCAN  TECHNIQUE: Ventilation images were obtained in multiple projections using inhaled aerosol technetium 99 M DTPA. Perfusion images were obtained in multiple projections after intravenous injection of Tc-21m MAA.  RADIOPHARMACEUTICALS:  40 mCi Tc-37m DTPA aerosol and 31mCi Tc-75m MAA  COMPARISON:  None.  FINDINGS: Ventilation: Heterogeneous ventilation with central deposition of radiotracer as can be seen with lower airways disease. There is asymmetric decreased ventilation of the left lung.  Perfusion: No wedge shaped peripheral perfusion defects to suggest acute pulmonary embolism. There is global decreased profusion to the left lung.  IMPRESSION: 1. No scintigraphic evidence of pulmonary embolus. 2. Matched global decreased ventilation and profusion to the left lung. This appearance can be seen with a centrally obstructing mass or mucus plugging. Recommend further evaluation with a CT of the chest.   Electronically Signed   By: Kathreen Devoid   On: 08/14/2014 15:02    Scheduled Meds: . amLODipine  5 mg Oral Daily  . aspirin EC  81 mg Oral Daily  . budesonide (PULMICORT) nebulizer solution  0.25 mg Nebulization  BID  . enoxaparin (LOVENOX) injection  40 mg Subcutaneous Q24H  . insulin aspart  0-20 Units Subcutaneous TID WC  . insulin glargine  15 Units Subcutaneous QHS  . ipratropium-albuterol  3 mL Nebulization TID  . levofloxacin  750 mg Oral Daily  . methylPREDNISolone (SOLU-MEDROL) injection  40 mg Intravenous 3 times per day  . pantoprazole  40 mg Oral QAC breakfast  . senna  1 tablet Oral BID  . sodium chloride  3 mL Intravenous Q12H   Continuous Infusions:   Principal Problem:   Acute respiratory failure with hypoxia Active Problems:   COPD with exacerbation   HTN (hypertension)   Diabetes mellitus type 2, uncomplicated   COPD exacerbation   Pulmonary HTN    Time spent: > 35 minutes  Velvet Bathe  [facility] Pager [phone] If 7PM-7AM, please contact night-coverage at [email or web address], password [name] 08/16/2014, 11:39 AM  LOS: 4 days

## 2014-08-17 LAB — GLUCOSE, CAPILLARY
Glucose-Capillary: 240 mg/dL — ABNORMAL HIGH (ref 70–99)
Glucose-Capillary: 195 mg/dL — ABNORMAL HIGH (ref 70–99)

## 2014-08-17 MED ORDER — AMLODIPINE BESYLATE 10 MG PO TABS: 10.0000 mg | ORAL_TABLET | Freq: Every day | ORAL | Status: AC

## 2014-08-17 MED ORDER — PREDNISONE (PAK) 10 MG PO TABS: ORAL_TABLET | Freq: Every day | ORAL | Status: DC

## 2014-08-17 MED ORDER — ASPIRIN 81 MG PO TBEC: 81.0000 mg | DELAYED_RELEASE_TABLET | Freq: Every day | ORAL | Status: AC

## 2014-08-17 MED ORDER — BENZONATATE 200 MG PO CAPS: 200.0000 mg | ORAL_CAPSULE | Freq: Three times a day (TID) | ORAL | Status: DC | PRN

## 2014-08-17 MED ORDER — AMLODIPINE BESYLATE 10 MG PO TABS: 10.0000 mg | ORAL_TABLET | Freq: Every day | ORAL | Status: DC

## 2014-08-17 MED ORDER — ALBUTEROL SULFATE HFA 108 (90 BASE) MCG/ACT IN AERS: 2.0000 | INHALATION_SPRAY | Freq: Four times a day (QID) | RESPIRATORY_TRACT | Status: AC | PRN

## 2014-08-17 MED ORDER — INSULIN GLARGINE 100 UNIT/ML ~~LOC~~ SOLN: 15.0000 [IU] | Freq: Every day | SUBCUTANEOUS | Status: DC

## 2014-08-17 NOTE — Discharge Summary (Signed)
Physician Discharge Summary  Alejandro Hall UUV:[id number] DOB: 1928/10/14 DOA: 08/12/2014  PCP: No primary care provider on file.  Admit date: 08/12/2014 Discharge date: 08/17/2014  Time spent: > 35 minutes  Recommendations for Outpatient Follow-up:   Please see discharge instructions below Follow-up with your primary care physician Monitor blood pressures patient will be discharged on amlodipine 10 mg by mouth daily While in house patient completed a five-day antibiotic course of Levaquin 750 mg daily  Discharge Diagnoses:  Principal Problem:   Acute respiratory failure with hypoxia Active Problems:   COPD with exacerbation   HTN (hypertension)   Diabetes mellitus type 2, uncomplicated   COPD exacerbation   Pulmonary HTN   Discharge Condition: Stable  Diet recommendation: Diabetic diet  Filed Weights   08/13/14 2100 08/14/14 2030 08/16/14 2100  Weight: 82.736 kg (182 lb 6.4 oz) 82.736 kg (182 lb 6.4 oz) 80.7 kg (177 lb 14.6 oz)    History of present illness:  80 year old with past medical history of COPD on home oxygen who presented complaining of shortness of breath and was treated for COPD exacerbation.  Hospital Course:  Acute COPD exacerbation - Improved with steroids, bronchodilators, and antibiotics. Patient completed appropriate antibiotic course as such will not be discharged with more antibiotics. - Patient to continue home regimen oxygen supplementation - Patient is to follow-up with pulmonologist or primary care physician within the next week - Will discharge on prednisone taper  DM -Recommend diabetic diet and will discharge with prescription for Lantus  Hypertension -We'll continue amlodipine on discharge   Procedures:  None  Consultations:  Seen initially by critical care  Discharge Exam: Filed Vitals:   08/17/14 1000  BP: 171/75  Pulse: 85  Temp: 98 F (36.7 C)  Resp: 20    General: Patient in no acute distress, alert and  awake Cardiovascular: Irregular rate and rhythm, no murmurs rubs Respiratory: Speaking in full sentences, no wheezes, no increased work of breathing  Discharge Instructions   Discharge Instructions    Call MD for:  difficulty breathing, headache or visual disturbances    Complete by:  As directed      Call MD for:  persistant dizziness or light-headedness    Complete by:  As directed      Call MD for:  temperature >100.4    Complete by:  As directed      Diet - low sodium heart healthy    Complete by:  As directed      Discharge instructions    Complete by:  As directed   Home health with Physical therapy.  Follow up with your primary care physician in 1-2 weeks or sooner should any new concerns arise.     Increase activity slowly    Complete by:  As directed           Current Discharge Medication List    START taking these medications   Details  albuterol (PROVENTIL HFA;VENTOLIN HFA) 108 (90 BASE) MCG/ACT inhaler Inhale 2 puffs into the lungs every 6 (six) hours as needed for wheezing or shortness of breath. Qty: 1 Inhaler, Refills: 0    amLODipine (NORVASC) 10 MG tablet Take 1 tablet (10 mg total) by mouth daily. Qty: 30 tablet, Refills: 0    aspirin EC 81 MG EC tablet Take 1 tablet (81 mg total) by mouth daily. Qty: 30 tablet, Refills: 0    benzonatate (TESSALON) 200 MG capsule Take 1 capsule (200 mg total) by mouth 3 (three) times daily  as needed for cough. Qty: 20 capsule, Refills: 0    insulin glargine (LANTUS) 100 UNIT/ML injection Inject 0.15 mLs (15 Units total) into the skin at bedtime. Qty: 10 mL, Refills: 0    predniSONE (STERAPRED UNI-PAK) 10 MG tablet Take by mouth daily. Take 50 mg by mouth for the next 3 days, then take 40 mg by mouth for the next 3 days, then take 30 mg by mouth for the next 3 days, then take 20 mg by mouth for the next 3 days, then take 10 mg by mouth for the next 3 days Qty: 40 tablet, Refills: 0      STOP taking these medications      PRESCRIPTION MEDICATION        No Known Allergies    The results of significant diagnostics from this hospitalization (including imaging, microbiology, ancillary and laboratory) are listed below for reference.    Significant Diagnostic Studies: Nm Pulmonary Perf And Vent  08/14/2014   CLINICAL DATA:  Shortness of breath for a week, COPD  EXAM: NUCLEAR MEDICINE VENTILATION - PERFUSION LUNG SCAN  TECHNIQUE: Ventilation images were obtained in multiple projections using inhaled aerosol technetium 99 M DTPA. Perfusion images were obtained in multiple projections after intravenous injection of Tc-31m MAA.  RADIOPHARMACEUTICALS:  40 mCi Tc-16m DTPA aerosol and 35mCi Tc-26m MAA  COMPARISON:  None.  FINDINGS: Ventilation: Heterogeneous ventilation with central deposition of radiotracer as can be seen with lower airways disease. There is asymmetric decreased ventilation of the left lung.  Perfusion: No wedge shaped peripheral perfusion defects to suggest acute pulmonary embolism. There is global decreased profusion to the left lung.  IMPRESSION: 1. No scintigraphic evidence of pulmonary embolus. 2. Matched global decreased ventilation and profusion to the left lung. This appearance can be seen with a centrally obstructing mass or mucus plugging. Recommend further evaluation with a CT of the chest.   Electronically Signed   By: Kathreen Devoid   On: 08/14/2014 15:02   Dg Chest Portable 1 View  08/12/2014   CLINICAL DATA:  Acute onset of shortness of breath and cough. Personal history of smoking. Initial encounter.  EXAM: PORTABLE CHEST - 1 VIEW  COMPARISON:  Chest radiograph performed 12/07/2010  FINDINGS: The lungs are hyperexpanded, likely reflecting COPD. The costophrenic angles are incompletely imaged on this study. There is no evidence of focal opacification, pleural effusion or pneumothorax.  The cardiomediastinal silhouette is borderline normal in size. No acute osseous abnormalities are seen.   IMPRESSION: No acute cardiopulmonary process seen.  Findings of COPD.   Electronically Signed   By: Garald Balding M.D.   On: 08/12/2014 06:27    Microbiology: Recent Results (from the past 240 hour(s))  MRSA PCR Screening     Status: None   Collection Time: 08/12/14  2:20 PM  Result Value Ref Range Status   MRSA by PCR NEGATIVE NEGATIVE Final    Comment:        The GeneXpert MRSA Assay (FDA approved for NASAL specimens only), is one component of a comprehensive MRSA colonization surveillance program. It is not intended to diagnose MRSA infection nor to guide or monitor treatment for MRSA infections.   Respiratory virus panel (routine influenza)     Status: Abnormal   Collection Time: 08/12/14  5:52 PM  Result Value Ref Range Status   Source - RVPAN NASOPHARYNGEAL  Final   Respiratory Syncytial Virus A DETECTED (A)  Final   Respiratory Syncytial Virus B NOT DETECTED  Final  Influenza A NOT DETECTED  Final   Influenza B NOT DETECTED  Final   Parainfluenza 1 NOT DETECTED  Final   Parainfluenza 2 NOT DETECTED  Final   Parainfluenza 3 NOT DETECTED  Final   Metapneumovirus NOT DETECTED  Final   Rhinovirus NOT DETECTED  Final   Adenovirus NOT DETECTED  Final   Influenza A H1 NOT DETECTED  Final   Influenza A H3 NOT DETECTED  Final    Comment: (NOTE)       Normal Reference Range for each Analyte: NOT DETECTED Testing performed using the Luminex xTAG Respiratory Viral Panel test kit. The analytical performance characteristics of this assay have been determined by [employer].  The modifications have not been cleared or approved by the FDA. This assay has been validated pursuant to the CLIA regulations and is used for clinical purposes. Performed at [employer] [facility]: Basic Metabolic Panel:  Recent Labs Lab 08/12/14 0515 08/12/14 1522 08/13/14 0135 08/15/14 1944 08/16/14 0500  NA 138  --  135*  --  138  K 4.1  --  4.1  --  4.3  CL 99  --  98   --  101  CO2 22  --  21  --  29  GLUCOSE 181*  --  260* 237* 210*  BUN 12  --  25*  --  28*  CREATININE 0.91 1.09 1.17  --  1.20  CALCIUM 8.5  --  8.4  --  8.3*   Liver Function Tests: No results for input(s): AST, ALT, ALKPHOS, BILITOT, PROT, ALBUMIN in the last 168 hours. No results for input(s): LIPASE, AMYLASE in the last 168 hours. No results for input(s): AMMONIA in the last 168 hours. CBC:  Recent Labs Lab 08/12/14 0515 08/12/14 1522 08/13/14 0135 08/16/14 0500  WBC 12.8* 6.8 12.9* 11.2*  HGB 15.1 13.9 13.5 13.8  HCT 45.6 42.7 41.5 42.5  MCV 87.5 85.6 85.7 85.9  PLT 257 252 251 270   Cardiac Enzymes:  Recent Labs Lab 08/12/14 1522 08/12/14 1954 08/13/14 0135  TROPONINI <0.30 <0.30 <0.30   BNP: BNP (last 3 results)  Recent Labs  08/14/14 2015  PROBNP 704.7*   CBG:  Recent Labs Lab 08/16/14 1125 08/16/14 1652 08/16/14 2159 08/17/14 0754 08/17/14 1128  GLUCAP 217* 228* 217* 240* 195*       Signed:  Velvet Bathe  [facility] 08/17/2014, 11:42 AM

## 2014-08-17 NOTE — Progress Notes (Signed)
Telemetry and saline lock removed (catheter intact). Discharge instructions reviewed with patient and family. Verbalized understanding of plan of care, medications, prescriptions, and appointment schedule. Patient is alert and oriented, hemodynamically stable, moves all extremeties, and denies pain.  He is being discharged on 2.5L O2 via nasal cannula with tank brought from home. Home health has been arranged.Patient was transported to the exit by staff and was released to the care of his son in stable condition.

## 2014-08-17 NOTE — Plan of Care (Signed)
Problem: Acute Rehab PT Goals(only PT should resolve) Goal: Pt Will Ambulate Outcome: Adequate for Discharge Ambulated 3460' with modified independent

## 2014-08-17 NOTE — Plan of Care (Signed)
Problem: Discharge Progression Outcomes Goal: Able to self administer respiratory meds Outcome: Adequate for Discharge Home health to support.

## 2014-08-17 NOTE — Progress Notes (Signed)
Physical Therapy Treatment Patient Details Name: Alejandro Hall MRN: [id number] DOB: 12/02/1928 Today's Date: Aug 30, 2014    History of Present Illness Patient is an 80 yo male admitte 08/12/14 with worsening SOB.  Patient with acute resp failure with hypoxia, COPD exacerbation.  PMH:  COPD on home O2, DM, HTN, seizures.    PT Comments    Patient did great today with gait and mobility.  Ambulated 5' with no loss of balance, reasonable pace and maintaining o2 sats at 94%.  No further PT needs in hospital, patient safe for d/c home with son from PT standpoint with intermittent supervision.   DO recommend HHPT for progressive mobility and increased independence.    Follow Up Recommendations  Home health PT;Supervision - Intermittent     Equipment Recommendations  None recommended by PT    Recommendations for Other Services       Precautions / Restrictions Precautions Precaution Comments: home o2    Mobility  Bed Mobility               General bed mobility comments: sitting in recliner  Transfers Overall transfer level: Modified independent Equipment used: None Transfers: Sit to/from Stand Sit to Stand: Modified independent (Device/Increase time)            Ambulation/Gait Ambulation/Gait assistance: Modified independent (Device/Increase time) Ambulation Distance (Feet): 60 Feet Assistive device: None Gait Pattern/deviations: WFL(Within Functional Limits)     General Gait Details: patient very steady during gait, no noticable SOB, o2 sats at 94% during gait.   Stairs            Wheelchair Mobility    Modified Rankin (Stroke Patients Only)       Balance Overall balance assessment: No apparent balance deficits (not formally assessed)                                  Cognition Arousal/Alertness: Awake/alert Behavior During Therapy: WFL for tasks assessed/performed Overall Cognitive Status: Within Functional Limits for tasks  assessed                      Exercises      General Comments        Pertinent Vitals/Pain Pain Assessment: No/denies pain    Home Living                      Prior Function            PT Goals (current goals can now be found in the care plan section) Progress towards PT goals: Goals met/education completed, patient discharged from PT    Frequency       PT Plan  (patient ready for d/c from PT standpoint)    Co-evaluation             End of Session Equipment Utilized During Treatment: Oxygen Activity Tolerance: Patient tolerated treatment well Patient left: in chair;with call bell/phone within reach     Time: 1023-1031 PT Time Calculation (min) (ACUTE ONLY): 8 min  Charges:  $Gait Training: 8-22 mins                    G Codes:      Shanna Cisco 08/30/2014, 10:33 AM  30-Aug-2014 [place], [place]

## 2014-08-17 NOTE — Plan of Care (Signed)
Problem: Phase II Progression Outcomes Goal: O2 sats > equal to 90% on RA or at baseline Outcome: Not Met (add Reason) Patient on O2 at home.

## 2014-08-17 NOTE — Progress Notes (Signed)
[facility]  Patient Status:  [facility]  [facility] is providing the following services: HHRN with possible orders for home PT as well. [facility] team will follow Mr. Alejandro Hall while inpatient to support transition home when ordered.  If patient discharges after hours, please call [phone].   Sedalia Mutaamela S Chandler 08/17/2014, 10:42 AM

## 2015-10-22 ENCOUNTER — Ambulatory Visit (HOSPITAL_COMMUNITY): Payer: Medicare Other

## 2015-10-22 ENCOUNTER — Inpatient Hospital Stay (HOSPITAL_COMMUNITY): Payer: Medicare Other

## 2015-10-22 ENCOUNTER — Emergency Department (HOSPITAL_COMMUNITY): Payer: Medicare Other

## 2015-10-22 ENCOUNTER — Inpatient Hospital Stay (HOSPITAL_COMMUNITY)
Admission: EM | Admit: 2015-10-22 | Discharge: 2015-10-26 | DRG: 190 | Disposition: A | Discharge status: Home Health | Payer: Medicare Other | Attending: Internal Medicine | Admitting: Internal Medicine

## 2015-10-22 ENCOUNTER — Encounter (HOSPITAL_COMMUNITY): Payer: Self-pay

## 2015-10-22 DIAGNOSIS — I5033 Acute on chronic diastolic (congestive) heart failure: Secondary | ICD-10-CM | POA: Diagnosis present

## 2015-10-22 DIAGNOSIS — Z66 Do not resuscitate: Secondary | ICD-10-CM | POA: Diagnosis present

## 2015-10-22 DIAGNOSIS — Z6829 Body mass index (BMI) 29.0-29.9, adult: Secondary | ICD-10-CM | POA: Diagnosis not present

## 2015-10-22 DIAGNOSIS — N179 Acute kidney failure, unspecified: Secondary | ICD-10-CM | POA: Diagnosis present

## 2015-10-22 DIAGNOSIS — Z9981 Dependence on supplemental oxygen: Secondary | ICD-10-CM

## 2015-10-22 DIAGNOSIS — J9621 Acute and chronic respiratory failure with hypoxia: Secondary | ICD-10-CM | POA: Diagnosis present

## 2015-10-22 DIAGNOSIS — I130 Hypertensive heart and chronic kidney disease with heart failure and stage 1 through stage 4 chronic kidney disease, or unspecified chronic kidney disease: Secondary | ICD-10-CM | POA: Diagnosis present

## 2015-10-22 DIAGNOSIS — R0600 Dyspnea, unspecified: Secondary | ICD-10-CM

## 2015-10-22 DIAGNOSIS — J441 Chronic obstructive pulmonary disease with (acute) exacerbation: Secondary | ICD-10-CM | POA: Diagnosis present

## 2015-10-22 DIAGNOSIS — E785 Hyperlipidemia, unspecified: Secondary | ICD-10-CM | POA: Diagnosis present

## 2015-10-22 DIAGNOSIS — Z7982 Long term (current) use of aspirin: Secondary | ICD-10-CM

## 2015-10-22 DIAGNOSIS — IMO0002 Reserved for concepts with insufficient information to code with codable children: Secondary | ICD-10-CM | POA: Diagnosis present

## 2015-10-22 DIAGNOSIS — N182 Chronic kidney disease, stage 2 (mild): Secondary | ICD-10-CM | POA: Diagnosis present

## 2015-10-22 DIAGNOSIS — J9601 Acute respiratory failure with hypoxia: Secondary | ICD-10-CM | POA: Diagnosis present

## 2015-10-22 DIAGNOSIS — M549 Dorsalgia, unspecified: Secondary | ICD-10-CM | POA: Diagnosis present

## 2015-10-22 DIAGNOSIS — I10 Essential (primary) hypertension: Secondary | ICD-10-CM | POA: Diagnosis not present

## 2015-10-22 DIAGNOSIS — Z7952 Long term (current) use of systemic steroids: Secondary | ICD-10-CM | POA: Diagnosis not present

## 2015-10-22 DIAGNOSIS — G40909 Epilepsy, unspecified, not intractable, without status epilepticus: Secondary | ICD-10-CM | POA: Diagnosis present

## 2015-10-22 DIAGNOSIS — Z79899 Other long term (current) drug therapy: Secondary | ICD-10-CM

## 2015-10-22 DIAGNOSIS — R609 Edema, unspecified: Secondary | ICD-10-CM | POA: Diagnosis not present

## 2015-10-22 DIAGNOSIS — Z87891 Personal history of nicotine dependence: Secondary | ICD-10-CM

## 2015-10-22 DIAGNOSIS — Z794 Long term (current) use of insulin: Secondary | ICD-10-CM | POA: Diagnosis not present

## 2015-10-22 DIAGNOSIS — E1122 Type 2 diabetes mellitus with diabetic chronic kidney disease: Secondary | ICD-10-CM | POA: Diagnosis present

## 2015-10-22 DIAGNOSIS — E1165 Type 2 diabetes mellitus with hyperglycemia: Secondary | ICD-10-CM | POA: Diagnosis present

## 2015-10-22 DIAGNOSIS — E119 Type 2 diabetes mellitus without complications: Secondary | ICD-10-CM | POA: Diagnosis not present

## 2015-10-22 DIAGNOSIS — R0602 Shortness of breath: Secondary | ICD-10-CM | POA: Diagnosis not present

## 2015-10-22 HISTORY — DX: Essential (primary) hypertension: I10

## 2015-10-22 HISTORY — DX: Personal history of urinary calculi: Z87.442

## 2015-10-22 HISTORY — DX: Reserved for inherently not codable concepts without codable children: IMO0001

## 2015-10-22 HISTORY — DX: Unspecified osteoarthritis, unspecified site: M19.90

## 2015-10-22 HISTORY — DX: Type 2 diabetes mellitus without complications: E11.9

## 2015-10-22 LAB — URINALYSIS, ROUTINE W REFLEX MICROSCOPIC
Bilirubin Urine: NEGATIVE
GLUCOSE, UA: 100 mg/dL — AB
Ketones, ur: NEGATIVE mg/dL
LEUKOCYTES UA: NEGATIVE
Nitrite: NEGATIVE
PH: 5.5 (ref 5.0–8.0)
Protein, ur: NEGATIVE mg/dL
SPECIFIC GRAVITY, URINE: 1.01 (ref 1.005–1.030)

## 2015-10-22 LAB — I-STAT ARTERIAL BLOOD GAS, ED
ACID-BASE DEFICIT: 8 mmol/L — AB (ref 0.0–2.0)
BICARBONATE: 16.5 meq/L — AB (ref 20.0–24.0)
O2 SAT: 95 %
PH ART: 7.317 — AB (ref 7.350–7.450)
PO2 ART: 83 mmHg (ref 80.0–100.0)
Patient temperature: 98.6
TCO2: 18 mmol/L (ref 0–100)
pCO2 arterial: 32.3 mmHg — ABNORMAL LOW (ref 35.0–45.0)

## 2015-10-22 LAB — BRAIN NATRIURETIC PEPTIDE: B NATRIURETIC PEPTIDE 5: 30.1 pg/mL (ref 0.0–100.0)

## 2015-10-22 LAB — CBC WITH DIFFERENTIAL/PLATELET
Basophils Absolute: 0 10*3/uL (ref 0.0–0.1)
Basophils Relative: 0 %
EOS ABS: 0 10*3/uL (ref 0.0–0.7)
Eosinophils Relative: 0 %
HCT: 40.1 % (ref 39.0–52.0)
Hemoglobin: 12.8 g/dL — ABNORMAL LOW (ref 13.0–17.0)
LYMPHS ABS: 0.8 10*3/uL (ref 0.7–4.0)
Lymphocytes Relative: 7 %
MCH: 27.5 pg (ref 26.0–34.0)
MCHC: 31.9 g/dL (ref 30.0–36.0)
MCV: 86.1 fL (ref 78.0–100.0)
Monocytes Absolute: 0.1 10*3/uL (ref 0.1–1.0)
Monocytes Relative: 0 %
Neutro Abs: 10.9 10*3/uL — ABNORMAL HIGH (ref 1.7–7.7)
Neutrophils Relative %: 93 %
PLATELETS: 266 10*3/uL (ref 150–400)
RBC: 4.66 MIL/uL (ref 4.22–5.81)
RDW: 14.3 % (ref 11.5–15.5)
WBC: 11.8 10*3/uL — AB (ref 4.0–10.5)

## 2015-10-22 LAB — COMPREHENSIVE METABOLIC PANEL
ALK PHOS: 51 U/L (ref 38–126)
ALT: 19 U/L (ref 17–63)
AST: 31 U/L (ref 15–41)
Albumin: 4.1 g/dL (ref 3.5–5.0)
Anion gap: 18 — ABNORMAL HIGH (ref 5–15)
BILIRUBIN TOTAL: 0.5 mg/dL (ref 0.3–1.2)
BUN: 22 mg/dL — AB (ref 6–20)
CALCIUM: 9.1 mg/dL (ref 8.9–10.3)
CHLORIDE: 104 mmol/L (ref 101–111)
CO2: 19 mmol/L — ABNORMAL LOW (ref 22–32)
CREATININE: 1.56 mg/dL — AB (ref 0.61–1.24)
GFR calc Af Amer: 45 mL/min — ABNORMAL LOW (ref >60)
GFR, EST NON AFRICAN AMERICAN: 39 mL/min — AB (ref >60)
Glucose, Bld: 523 mg/dL — ABNORMAL HIGH (ref 65–99)
Potassium: 3.4 mmol/L — ABNORMAL LOW (ref 3.5–5.1)
Sodium: 141 mmol/L (ref 135–145)
Total Protein: 7.2 g/dL (ref 6.5–8.1)

## 2015-10-22 LAB — URINE MICROSCOPIC-ADD ON

## 2015-10-22 LAB — MRSA PCR SCREENING: MRSA by PCR: NEGATIVE

## 2015-10-22 LAB — CBC
HCT: 43.7 % (ref 39.0–52.0)
Hemoglobin: 14.2 g/dL (ref 13.0–17.0)
MCH: 27.8 pg (ref 26.0–34.0)
MCHC: 32.5 g/dL (ref 30.0–36.0)
MCV: 85.5 fL (ref 78.0–100.0)
Platelets: 267 10*3/uL (ref 150–400)
RBC: 5.11 MIL/uL (ref 4.22–5.81)
RDW: 14 % (ref 11.5–15.5)
WBC: 14.5 10*3/uL — ABNORMAL HIGH (ref 4.0–10.5)

## 2015-10-22 LAB — GLUCOSE, CAPILLARY
GLUCOSE-CAPILLARY: 103 mg/dL — AB (ref 65–99)
GLUCOSE-CAPILLARY: 155 mg/dL — AB (ref 65–99)
Glucose-Capillary: 161 mg/dL — ABNORMAL HIGH (ref 65–99)
Glucose-Capillary: 125 mg/dL — ABNORMAL HIGH (ref 65–99)
Glucose-Capillary: 220 mg/dL — ABNORMAL HIGH (ref 65–99)

## 2015-10-22 LAB — CBG MONITORING, ED
GLUCOSE-CAPILLARY: 535 mg/dL — AB (ref 65–99)
GLUCOSE-CAPILLARY: 318 mg/dL — AB (ref 65–99)
Glucose-Capillary: 522 mg/dL — ABNORMAL HIGH (ref 65–99)
Glucose-Capillary: 564 mg/dL (ref 65–99)
Glucose-Capillary: 563 mg/dL (ref 65–99)
Glucose-Capillary: 477 mg/dL — ABNORMAL HIGH (ref 65–99)
Glucose-Capillary: 364 mg/dL — ABNORMAL HIGH (ref 65–99)
Glucose-Capillary: 276 mg/dL — ABNORMAL HIGH (ref 65–99)

## 2015-10-22 LAB — I-STAT TROPONIN, ED: Troponin i, poc: 0.02 ng/mL (ref 0.00–0.08)

## 2015-10-22 LAB — BASIC METABOLIC PANEL
Anion gap: 14 (ref 5–15)
BUN: 20 mg/dL (ref 6–20)
CALCIUM: 9.4 mg/dL (ref 8.9–10.3)
CO2: 24 mmol/L (ref 22–32)
Chloride: 101 mmol/L (ref 101–111)
Creatinine, Ser: 1.17 mg/dL (ref 0.61–1.24)
GFR, EST NON AFRICAN AMERICAN: 55 mL/min — AB (ref >60)
GLUCOSE: 468 mg/dL — AB (ref 65–99)
Potassium: 3.8 mmol/L (ref 3.5–5.1)
Sodium: 139 mmol/L (ref 135–145)

## 2015-10-22 LAB — TROPONIN I: Troponin I: 0.03 ng/mL (ref <0.031)

## 2015-10-22 LAB — INFLUENZA PANEL BY PCR (TYPE A & B)
H1N1 flu by pcr: NOT DETECTED
INFLAPCR: NEGATIVE
Influenza B By PCR: NEGATIVE

## 2015-10-22 LAB — D-DIMER, QUANTITATIVE: D-Dimer, Quant: <0.27 ug/mL-FEU (ref 0.00–0.50)

## 2015-10-22 MED ORDER — DEXTROSE 50 % IV SOLN: 25.0000 mL | INTRAVENOUS | Status: DC | PRN

## 2015-10-22 MED ORDER — LEVALBUTEROL HCL 0.63 MG/3ML IN NEBU
0.6300 mg | INHALATION_SOLUTION | Freq: Four times a day (QID) | RESPIRATORY_TRACT | Status: DC
  Administered 2015-10-22 – 2015-10-25 (×14): 0.63 mg via RESPIRATORY_TRACT
  Filled 2015-10-22 (×16): qty 3

## 2015-10-22 MED ORDER — INSULIN GLARGINE 100 UNIT/ML ~~LOC~~ SOLN
10.0000 [IU] | Freq: Every day | SUBCUTANEOUS | Status: DC
  Administered 2015-10-22: 10 [IU] via SUBCUTANEOUS
  Filled 2015-10-22: qty 0.1

## 2015-10-22 MED ORDER — SODIUM CHLORIDE 0.9 % IV SOLN
INTRAVENOUS | Status: DC
  Administered 2015-10-22: 5 [IU]/h via INTRAVENOUS
  Filled 2015-10-22: qty 2.5

## 2015-10-22 MED ORDER — FUROSEMIDE 10 MG/ML IJ SOLN
20.0000 mg | Freq: Once | INTRAMUSCULAR | Status: AC
  Administered 2015-10-22: 20 mg via INTRAVENOUS
  Filled 2015-10-22: qty 2

## 2015-10-22 MED ORDER — BUDESONIDE 0.5 MG/2ML IN SUSP
0.2500 mg | Freq: Two times a day (BID) | RESPIRATORY_TRACT | Status: DC
  Administered 2015-10-22 – 2015-10-23 (×3): 0.25 mg via RESPIRATORY_TRACT
  Filled 2015-10-22 (×5): qty 2

## 2015-10-22 MED ORDER — SODIUM CHLORIDE 0.9% FLUSH
3.0000 mL | Freq: Two times a day (BID) | INTRAVENOUS | Status: DC
  Administered 2015-10-22 – 2015-10-26 (×7): 3 mL via INTRAVENOUS

## 2015-10-22 MED ORDER — DEXTROSE-NACL 5-0.45 % IV SOLN: INTRAVENOUS | Status: DC

## 2015-10-22 MED ORDER — INSULIN ASPART 100 UNIT/ML ~~LOC~~ SOLN
0.0000 [IU] | Freq: Three times a day (TID) | SUBCUTANEOUS | Status: DC
  Administered 2015-10-23: 2 [IU] via SUBCUTANEOUS
  Administered 2015-10-23: 3 [IU] via SUBCUTANEOUS
  Administered 2015-10-23 – 2015-10-24 (×3): 4 [IU] via SUBCUTANEOUS
  Administered 2015-10-25: 2 [IU] via SUBCUTANEOUS

## 2015-10-22 MED ORDER — LEVOFLOXACIN IN D5W 500 MG/100ML IV SOLN
500.0000 mg | INTRAVENOUS | Status: DC
  Administered 2015-10-23 – 2015-10-24 (×2): 500 mg via INTRAVENOUS
  Filled 2015-10-22 (×3): qty 100

## 2015-10-22 MED ORDER — INSULIN ASPART 100 UNIT/ML ~~LOC~~ SOLN: 3.0000 [IU] | Freq: Three times a day (TID) | SUBCUTANEOUS | Status: DC

## 2015-10-22 MED ORDER — METHYLPREDNISOLONE SODIUM SUCC 40 MG IJ SOLR
40.0000 mg | Freq: Two times a day (BID) | INTRAMUSCULAR | Status: DC
  Administered 2015-10-22 – 2015-10-23 (×3): 40 mg via INTRAVENOUS
  Filled 2015-10-22 (×3): qty 1

## 2015-10-22 MED ORDER — INSULIN ASPART 100 UNIT/ML ~~LOC~~ SOLN
0.0000 [IU] | Freq: Three times a day (TID) | SUBCUTANEOUS | Status: DC
  Administered 2015-10-23: 11 [IU] via SUBCUTANEOUS
  Administered 2015-10-23: 15 [IU] via SUBCUTANEOUS
  Administered 2015-10-23: 7 [IU] via SUBCUTANEOUS
  Administered 2015-10-24: 11 [IU] via SUBCUTANEOUS
  Administered 2015-10-24 (×2): 7 [IU] via SUBCUTANEOUS
  Administered 2015-10-25: 3 [IU] via SUBCUTANEOUS
  Administered 2015-10-25: 15 [IU] via SUBCUTANEOUS
  Administered 2015-10-25: 7 [IU] via SUBCUTANEOUS
  Administered 2015-10-26: 3 [IU] via SUBCUTANEOUS
  Administered 2015-10-26: 15 [IU] via SUBCUTANEOUS

## 2015-10-22 MED ORDER — ACETAMINOPHEN 650 MG RE SUPP: 650.0000 mg | Freq: Four times a day (QID) | RECTAL | Status: DC | PRN

## 2015-10-22 MED ORDER — ONDANSETRON HCL 4 MG/2ML IJ SOLN: 4.0000 mg | Freq: Four times a day (QID) | INTRAMUSCULAR | Status: DC | PRN

## 2015-10-22 MED ORDER — ALBUTEROL (5 MG/ML) CONTINUOUS INHALATION SOLN
10.0000 mg/h | INHALATION_SOLUTION | Freq: Once | RESPIRATORY_TRACT | Status: AC
  Administered 2015-10-22: 10 mg/h via RESPIRATORY_TRACT
  Filled 2015-10-22: qty 20

## 2015-10-22 MED ORDER — INSULIN ASPART 100 UNIT/ML ~~LOC~~ SOLN
0.0000 [IU] | Freq: Three times a day (TID) | SUBCUTANEOUS | Status: DC
  Administered 2015-10-22: 15 [IU] via SUBCUTANEOUS
  Filled 2015-10-22 (×2): qty 1

## 2015-10-22 MED ORDER — LEVALBUTEROL HCL 0.63 MG/3ML IN NEBU
0.6300 mg | INHALATION_SOLUTION | Freq: Four times a day (QID) | RESPIRATORY_TRACT | Status: DC | PRN
  Administered 2015-10-24: 0.63 mg via RESPIRATORY_TRACT
  Filled 2015-10-22 (×2): qty 3

## 2015-10-22 MED ORDER — AMLODIPINE BESYLATE 10 MG PO TABS
10.0000 mg | ORAL_TABLET | Freq: Every day | ORAL | Status: DC
  Administered 2015-10-22 – 2015-10-26 (×5): 10 mg via ORAL
  Filled 2015-10-22 (×2): qty 1
  Filled 2015-10-22: qty 2
  Filled 2015-10-22 (×2): qty 1

## 2015-10-22 MED ORDER — ZOLPIDEM TARTRATE 5 MG PO TABS
5.0000 mg | ORAL_TABLET | Freq: Once | ORAL | Status: AC
  Administered 2015-10-22: 5 mg via ORAL
  Filled 2015-10-22: qty 1

## 2015-10-22 MED ORDER — LEVOFLOXACIN IN D5W 500 MG/100ML IV SOLN
500.0000 mg | Freq: Once | INTRAVENOUS | Status: AC
  Administered 2015-10-22: 500 mg via INTRAVENOUS
  Filled 2015-10-22: qty 100

## 2015-10-22 MED ORDER — GLIMEPIRIDE 2 MG PO TABS
2.0000 mg | ORAL_TABLET | Freq: Every day | ORAL | Status: DC
  Administered 2015-10-22: 2 mg via ORAL
  Filled 2015-10-22: qty 1

## 2015-10-22 MED ORDER — ACETAMINOPHEN 325 MG PO TABS
650.0000 mg | ORAL_TABLET | Freq: Four times a day (QID) | ORAL | Status: DC | PRN
  Administered 2015-10-25: 650 mg via ORAL
  Filled 2015-10-22: qty 2

## 2015-10-22 MED ORDER — INSULIN GLARGINE 100 UNIT/ML ~~LOC~~ SOLN
10.0000 [IU] | Freq: Every day | SUBCUTANEOUS | Status: DC
  Administered 2015-10-22 – 2015-10-23 (×2): 10 [IU] via SUBCUTANEOUS
  Filled 2015-10-22 (×2): qty 0.1

## 2015-10-22 MED ORDER — ONDANSETRON HCL 4 MG PO TABS: 4.0000 mg | ORAL_TABLET | Freq: Four times a day (QID) | ORAL | Status: DC | PRN

## 2015-10-22 MED ORDER — INSULIN GLARGINE 100 UNIT/ML ~~LOC~~ SOLN
10.0000 [IU] | Freq: Every day | SUBCUTANEOUS | Status: DC
  Filled 2015-10-22: qty 0.1

## 2015-10-22 MED ORDER — ENOXAPARIN SODIUM 40 MG/0.4ML ~~LOC~~ SOLN
40.0000 mg | SUBCUTANEOUS | Status: DC
  Administered 2015-10-22 – 2015-10-25 (×4): 40 mg via SUBCUTANEOUS
  Filled 2015-10-22 (×4): qty 0.4

## 2015-10-22 MED ORDER — SODIUM CHLORIDE 0.9 % IV SOLN
INTRAVENOUS | Status: DC
  Administered 2015-10-22: 10:00:00 via INTRAVENOUS

## 2015-10-22 MED ORDER — IPRATROPIUM BROMIDE 0.02 % IN SOLN
0.5000 mg | Freq: Four times a day (QID) | RESPIRATORY_TRACT | Status: DC
  Administered 2015-10-22 – 2015-10-25 (×13): 0.5 mg via RESPIRATORY_TRACT
  Filled 2015-10-22 (×14): qty 2.5

## 2015-10-22 MED ORDER — ATORVASTATIN CALCIUM 10 MG PO TABS
10.0000 mg | ORAL_TABLET | Freq: Every day | ORAL | Status: DC
  Administered 2015-10-22 – 2015-10-26 (×5): 10 mg via ORAL
  Filled 2015-10-22 (×5): qty 1

## 2015-10-22 NOTE — ED Notes (Signed)
Breakfast Tray ordered @ [id number].

## 2015-10-22 NOTE — Progress Notes (Signed)
Last cbg 103. Patient on isulin drip. Dr. Dala Dock paged and to follow; oncoming shift aware.

## 2015-10-22 NOTE — ED Notes (Signed)
Notified MD of pt. High blood sugar.

## 2015-10-22 NOTE — Progress Notes (Signed)
PROGRESS NOTE  Alejandro Hall JXB:[id number] DOB: 20-Apr-1929 DOA: 10/22/2015 PCP: Lillia Mountain, MD  HPI/Recap of past 24 hours:  Blood sugar elevated, not able to bring down by subQ insulin, remain sob  Assessment/Plan: Principal Problem:   Acute respiratory failure with hypoxia (HCC) Active Problems:   HTN (hypertension)   COPD exacerbation (HCC)   Diabetes mellitus type 2, uncontrolled (HCC)   1. Acute on chronic respiratory failure with hypoxia (on home o2 2liters)-  From COPD exacerbation/diastolic chf? s/p Lasix 20 mg IV, continue nebulizer/ IV steroids and Levaquin.  d-dimer wnl.. cardiac markers negative. check 2-D echo. Rapid influenza PCR, respiratory viral panel pending.  2. Diabetes mellitus type 2 uncontrolled -become significantly elevated, started insulin drip. Check hemoglobin A1c and repeat metabolic panel. 3. Hypertension on amlodipine. 4. Hyperlipidemia on statins. 5. AKI on CKDII, baseline cr around 1 in 2015, today 1.56. ua pending, renal dosing meds, repeat labs in am    Code Status: DNR  Family Communication: patient   Disposition Plan: to stepdown on insulin drip   Consultants:  none  Procedures:  none  Antibiotics:  levaquin   Objective: BP 128/103 mmHg  Pulse 98  Temp(Src) 98.7 F (37.1 C) (Oral)  Resp 26  Ht  (1.727 m)  Wt 89.3 kg (196 lb 13.9 oz)  BMI 29.94 kg/m2  SpO2 95%  Intake/Output Summary (Last 24 hours) at 10/22/15 1940 Last data filed at 10/22/15 1700  Gross per 24 hour  Intake      0 ml  Output   1725 ml  Net  -1725 ml   Filed Weights   10/22/15 0031 10/22/15 1619  Weight: 87.998 kg (194 lb) 89.3 kg (196 lb 13.9 oz)    Exam:   General:  Sob, need to stop in the middle of conversation due to sob  Cardiovascular: sinus tachcardia  Respiratory: very diminished, end expritory  wheezing   Abdomen: Soft/ND/NT, positive BS  Musculoskeletal:  Edema right lower extremity  Neuro: aaox3  Skin:  scattered dark mole, back lesion (reported s/p mole resection recently)  Data Reviewed: Basic Metabolic Panel:  Recent Labs Lab 10/22/15 0030 10/22/15 0621  NA 139 141  K 3.8 3.4*  CL 101 104  CO2 24 19*  GLUCOSE 468* 523*  BUN 20 22*  CREATININE 1.17 1.56*  CALCIUM 9.4 9.1   Liver Function Tests:  Recent Labs Lab 10/22/15 0621  AST 31  ALT 19  ALKPHOS 51  BILITOT 0.5  PROT 7.2  ALBUMIN 4.1   No results for input(s): LIPASE, AMYLASE in the last 168 hours. No results for input(s): AMMONIA in the last 168 hours. CBC:  Recent Labs Lab 10/22/15 0030 10/22/15 0621  WBC 14.5* 11.8*  NEUTROABS  --  10.9*  HGB 14.2 12.8*  HCT 43.7 40.1  MCV 85.5 86.1  PLT 267 266   Cardiac Enzymes:    Recent Labs Lab 10/22/15 0621 10/22/15 1626  TROPONINI <0.03 <0.03   BNP (last 3 results)  Recent Labs  10/22/15 0635  BNP 30.1    ProBNP (last 3 results) No results for input(s): PROBNP in the last 8760 hours.  CBG:  Recent Labs Lab 10/22/15 1108 10/22/15 1210 10/22/15 1313 10/22/15 1419 10/22/15 1510  GLUCAP 535* 477* 364* 318* 276*    Recent Results (from the past 240 hour(s))  MRSA PCR Screening     Status: None   Collection Time: 10/22/15  5:20 PM  Result Value Ref Range Status   MRSA  by PCR NEGATIVE NEGATIVE Final    Comment:        The GeneXpert MRSA Assay (FDA approved for NASAL specimens only), is one component of a comprehensive MRSA colonization surveillance program. It is not intended to diagnose MRSA infection nor to guide or monitor treatment for MRSA infections.      Studies: Dg Chest 2 View  10/22/2015  CLINICAL DATA:  82 year old male with shortness of breath EXAM: CHEST  2 VIEW COMPARISON:  Radiograph dated 08/12/2014 FINDINGS: Two views of the chest demonstrate emphysematous changes of the lungs. There is no focal consolidation, pleural effusion, or pneumothorax. Stable cardiac silhouette. No acute osseous pathology. IMPRESSION:  No active cardiopulmonary disease. Electronically Signed   By: Elgie Collard M.D.   On: 10/22/2015 02:33    Scheduled Meds: . amLODipine  10 mg Oral Daily  . atorvastatin  10 mg Oral Daily  . budesonide (PULMICORT) nebulizer solution  0.25 mg Nebulization BID  . enoxaparin (LOVENOX) injection  40 mg Subcutaneous Q24H  . ipratropium  0.5 mg Nebulization Q6H  . levalbuterol  0.63 mg Nebulization Q6H  . [START ON 10/23/2015] levofloxacin (LEVAQUIN) IV  500 mg Intravenous Q24H  . methylPREDNISolone (SOLU-MEDROL) injection  40 mg Intravenous Q12H  . sodium chloride flush  3 mL Intravenous Q12H    Continuous Infusions: . sodium chloride 75 mL/hr at 10/22/15 0954  . dextrose 5 % and 0.45% NaCl    . insulin (NOVOLIN-R) infusion 10.8 Units/hr (10/22/15 1522)     Time spent: from 9:15 to 9:50  Clytee Heinrich MD, PhD  [facility] Pager [phone]. If 7PM-7AM, please contact night-coverage at [email or web address], password [facility] 10/22/2015, 7:40 PM  LOS: 0 days

## 2015-10-22 NOTE — ED Notes (Signed)
Attempted report x1. 

## 2015-10-22 NOTE — ED Notes (Signed)
Attempted report x 2 

## 2015-10-22 NOTE — Progress Notes (Signed)
Pharmacy Antibiotic Note  Alejandro Hall is a 82 y.o. male admitted on 10/22/2015 with COPD.  Pharmacy has been consulted for Levaquin dosing.  Plan: Levaquin  IV Q24H.  Height:  (172.7 cm) Weight: 194 lb (87.998 kg) IBW/kg (Calculated) : 68.4  Temp (24hrs), Avg:98 F (36.7 C), Min:98 F (36.7 C), Max:98 F (36.7 C)   Recent Labs Lab 10/22/15 0030  WBC 14.5*  CREATININE 1.17    Estimated Creatinine Clearance: 50.7 mL/min (by C-G formula based on Cr of 1.17).    No Known Allergies    Thank you for allowing pharmacy to be a part of this patient's care.  Vernard Gambles, PharmD, BCPS  10/22/2015 6:03 AM

## 2015-10-22 NOTE — ED Notes (Signed)
Placed order for pt. Lunch tray 

## 2015-10-22 NOTE — ED Provider Notes (Signed)
CSN: [id number]     Arrival date & time 10/22/15  0021 History  By signing my name below, I, Budd Palmer, attest that this documentation has been prepared under the direction and in the presence of Gilda Crease, MD. Electronically Signed: Budd Palmer, ED Scribe. 10/22/2015. 12:37 AM.    Chief Complaint  Patient presents with  . Shortness of Breath   The history is provided by the patient. No language interpreter was used.   HPI Comments: JEFFRE ENRIQUES is a 82 y.o. male former smoker with a PMHx of COPD and seizures brought in by ambulance, who presents to the Emergency Department complaining of worsening, sudden onset SOB that began 2 hours ago. He notes his breathing has improved somewhat since receiving 10 mg albuterol, 0.5 mg Atrovent, and 125 mg solumedrol from EMS. He also reports posterior neck pain, shoulder pain, and mid-back pain for the past week. He endorses a PMHx of shingles 2 years ago, and notes he has been having occasional upper back pains since then. He states he usually has 2L of oxygen at home.  Past Medical History  Diagnosis Date  . COPD (chronic obstructive pulmonary disease) (HCC)   . Seizures (HCC)    History reviewed. No pertinent past surgical history. History reviewed. No pertinent family history. Social History  Substance Use Topics  . Smoking status: Former Games developer  . Smokeless tobacco: Never Used  . Alcohol Use: No    Review of Systems  Respiratory: Positive for shortness of breath.   Musculoskeletal: Positive for myalgias, back pain and neck pain.  All other systems reviewed and are negative.   Allergies  Review of patient's allergies indicates no known allergies.  Home Medications   Prior to Admission medications   Medication Sig Start Date End Date Taking? Authorizing Provider  albuterol (PROVENTIL HFA;VENTOLIN HFA) 108 (90 BASE) MCG/ACT inhaler Inhale 2 puffs into the lungs every 6 (six) hours as needed for wheezing or shortness  of breath. 08/17/14  Yes Penny Pia, MD  amLODipine (NORVASC) 10 MG tablet Take 1 tablet (10 mg total) by mouth daily. 08/18/14  Yes Penny Pia, MD  atorvastatin (LIPITOR) 10 MG tablet Take 10 mg by mouth daily. 08/09/15  Yes Historical Provider, MD  glimepiride (AMARYL) 2 MG tablet Take 2 mg by mouth daily with breakfast. 09/25/15  Yes Historical Provider, MD  aspirin EC 81 MG EC tablet Take 1 tablet (81 mg total) by mouth daily. Patient not taking: Reported on 10/22/2015 08/17/14   Penny Pia, MD  benzonatate (TESSALON) 200 MG capsule Take 1 capsule (200 mg total) by mouth 3 (three) times daily as needed for cough. Patient not taking: Reported on 10/22/2015 08/17/14   Penny Pia, MD  insulin glargine (LANTUS) 100 UNIT/ML injection Inject 0.15 mLs (15 Units total) into the skin at bedtime. Patient not taking: Reported on 10/22/2015 08/17/14   Penny Pia, MD  predniSONE (STERAPRED UNI-PAK) 10 MG tablet Take by mouth daily. Take 50 mg by mouth for the next 3 days, then take 40 mg by mouth for the next 3 days, then take 30 mg by mouth for the next 3 days, then take 20 mg by mouth for the next 3 days, then take 10 mg by mouth for the next 3 days Patient not taking: Reported on 10/22/2015 08/17/14   Penny Pia, MD   BP 122/55 mmHg  Pulse 86  Temp(Src) 98 F (36.7 C) (Oral)  Resp 31  Ht 5\' 8"  (1.727 m)  Wt  194 lb (87.998 kg)  BMI 29.50 kg/m2  SpO2 94% Physical Exam  Constitutional: He is oriented to person, place, and time. He appears well-developed and well-nourished. No distress.  HENT:  Head: Normocephalic and atraumatic.  Right Ear: Hearing normal.  Left Ear: Hearing normal.  Nose: Nose normal.  Mouth/Throat: Oropharynx is clear and moist and mucous membranes are normal.  Eyes: Conjunctivae and EOM are normal. Pupils are equal, round, and reactive to light.  Neck: Normal range of motion. Neck supple.  Cardiovascular: Regular rhythm, S1 normal and S2 normal.  Exam reveals no  gallop and no friction rub.   No murmur heard. Pulmonary/Chest: Effort normal. He has decreased breath sounds. He exhibits no tenderness.  Breath sounds diminished bilaterally  Abdominal: Soft. Normal appearance and bowel sounds are normal. There is no hepatosplenomegaly. There is no tenderness. There is no rebound, no guarding, no tenderness at McBurney's point and negative Murphy's sign. No hernia.  Musculoskeletal: Normal range of motion.  Neurological: He is alert and oriented to person, place, and time. He has normal strength. No cranial nerve deficit or sensory deficit. Coordination normal. GCS eye subscore is 4. GCS verbal subscore is 5. GCS motor subscore is 6.  Skin: Skin is warm, dry and intact. No rash noted. No cyanosis.  Psychiatric: He has a normal mood and affect. His speech is normal and behavior is normal. Thought content normal.  Nursing note and vitals reviewed.   ED Course  Procedures  DIAGNOSTIC STRAUDIES: Oxygen Saturation is 96% on 6 L/min [place], adequate by my interpretation.    COORDINATION OF CARE: 12:32 AM - Discussed plans to continue breathing treatment. Pt advised of plan for treatment and pt agrees.  Labs Review Labs Reviewed  BASIC METABOLIC PANEL - Abnormal; Notable for the following:    Glucose, Bld 468 (*)    GFR calc non Af Amer 55 (*)    All other components within normal limits  CBC - Abnormal; Notable for the following:    WBC 14.5 (*)    All other components within normal limits    Imaging Review Dg Chest 2 View  10/22/2015  CLINICAL DATA:  82 year old male with shortness of breath EXAM: CHEST  2 VIEW COMPARISON:  Radiograph dated 08/12/2014 FINDINGS: Two views of the chest demonstrate emphysematous changes of the lungs. There is no focal consolidation, pleural effusion, or pneumothorax. Stable cardiac silhouette. No acute osseous pathology. IMPRESSION: No active cardiopulmonary disease. Electronically Signed   By: Elgie Collard M.D.   On:  10/22/2015 02:33   I have personally reviewed and evaluated these images and lab results as part of my medical decision-making.   EKG Interpretation   Date/Time:  Monday October 22 2015 00:26:47 EST Ventricular Rate:  116 PR Interval:    QRS Duration: 83 QT Interval:  306 QTC Calculation: 425 R Axis:   53 Text Interpretation:  Sinus tachycardia Nonspecific repol abnormality,  diffuse leads No significant change since last tracing Confirmed by  POLLINA  MD, CHRISTOPHER [phone]) on 10/22/2015 1:19:52 AM      MDM   Final diagnoses:  COPD exacerbation (HCC)    Patient presents to the ER for evaluation of severe shortness of breath. Patient arrived very tachypneic and mildly tachycardic. He had evidence of severe bronchospasm on arrival. Patient had been maximally treated by EMS with Solu-Medrol, albuterol, Atrovent. He improved with treatment but was still very short of breath at arrival. Patient has not been hypoxic, but continues to exhibit severe bronchospasm  and tachypnea. He will require hospitalization for further management of COPD exacerbation.  I personally performed the services described in this documentation, which was scribed in my presence. The recorded information has been reviewed and is accurate.   Gilda Crease, MD 10/22/15 [phone]

## 2015-10-22 NOTE — H&P (Addendum)
[facility] History and Physical  ABLE MALLOY ZOX:[id number] DOB: Mar 12, 1929 DOA: 10/22/2015  Referring physician: Dr. Franky Macho. PCP: Lillia Mountain, MD  Specialists: None.  Chief Complaint: Shortness of breath.  HPI: Alejandro Hall is a 82 y.o. male history of COPD on home oxygen, diabetes mellitus type 2, hypertension and hyperlipidemia presents to the ER because of sudden onset of shortness of breath since last night. Patient states his oxygenation has not been working for last few days. Last evening he suddenly became short of breath and since he has not had any oxygen at home is shortness of breath became worse. Has benign productive cough which is chronic. Patient also has chest pain on the left side which patient states has been chronic since last 3 years after his shingles. No new rash. In the ER patient initially was found to be wheezing and was given nebulizer. Chest x-ray does not show anything acute. On exam patient is minimally short of breath still. Patient's blood sugar is elevated and patient states his sugar runs around 200-300 home. Denies any nausea vomiting abdominal pain or diarrhea.   Review of Systems: As presented in the history of presenting illness, rest negative.  Past Medical History  Diagnosis Date  . COPD (chronic obstructive pulmonary disease) (HCC)   . Seizures (HCC)    History reviewed. No pertinent past surgical history. Social History:  reports that he has quit smoking. He has never used smokeless tobacco. He reports that he does not drink alcohol. His drug history is not on file. Where does patient live Home. Can patient participate in ADLs? S.  No Known Allergies  Family History:  Family History  Problem Relation Age of Onset  . Diabetes Mellitus II Neg Hx   . Hypertension Neg Hx       Prior to Admission medications   Medication Sig Start Date End Date Taking? Authorizing Provider  albuterol (PROVENTIL HFA;VENTOLIN HFA) 108 (90 BASE)  MCG/ACT inhaler Inhale 2 puffs into the lungs every 6 (six) hours as needed for wheezing or shortness of breath. 08/17/14  Yes Penny Pia, MD  amLODipine (NORVASC) 10 MG tablet Take 1 tablet (10 mg total) by mouth daily. 08/18/14  Yes Penny Pia, MD  atorvastatin (LIPITOR) 10 MG tablet Take 10 mg by mouth daily. 08/09/15  Yes Historical Provider, MD  glimepiride (AMARYL) 2 MG tablet Take 2 mg by mouth daily with breakfast. 09/25/15  Yes Historical Provider, MD  aspirin EC 81 MG EC tablet Take 1 tablet (81 mg total) by mouth daily. Patient not taking: Reported on 10/22/2015 08/17/14   Penny Pia, MD  benzonatate (TESSALON) 200 MG capsule Take 1 capsule (200 mg total) by mouth 3 (three) times daily as needed for cough. Patient not taking: Reported on 10/22/2015 08/17/14   Penny Pia, MD  insulin glargine (LANTUS) 100 UNIT/ML injection Inject 0.15 mLs (15 Units total) into the skin at bedtime. Patient not taking: Reported on 10/22/2015 08/17/14   Penny Pia, MD  predniSONE (STERAPRED UNI-PAK) 10 MG tablet Take by mouth daily. Take 50 mg by mouth for the next 3 days, then take 40 mg by mouth for the next 3 days, then take 30 mg by mouth for the next 3 days, then take 20 mg by mouth for the next 3 days, then take 10 mg by mouth for the next 3 days Patient not taking: Reported on 10/22/2015 08/17/14   Penny Pia, MD    Physical Exam: Filed Vitals:   10/22/15 0130  10/22/15 0145 10/22/15 0200 10/22/15 0330  BP: 118/60 116/64 133/61 122/55  Pulse: 104 100 106 86  Temp:      TempSrc:      Resp: 25 27 20 31   Height:      Weight:      SpO2: 99% 98% 93% 94%     General:  Moderately built and nourished.  Eyes: Anicteric no pallor.  ENT: No discharge from the ears eyes nose mouth.  Neck: No JVD appreciated no mass felt.  Cardiovascular: S1-S2 heard.  Respiratory: Mild expiratory wheeze no crepitations.  Abdomen: Soft nontender bowel sounds present.  Skin: No  rash.  Musculoskeletal: No edema.  Psychiatric: Appears normal.  Neurologic: Alert awake oriented to time place and person. Moves all extremities.  Labs on Admission:  Basic Metabolic Panel:  Recent Labs Lab 10/22/15 0030  NA 139  K 3.8  CL 101  CO2 24  GLUCOSE 468*  BUN 20  CREATININE 1.17  CALCIUM 9.4   Liver Function Tests: No results for input(s): AST, ALT, ALKPHOS, BILITOT, PROT, ALBUMIN in the last 168 hours. No results for input(s): LIPASE, AMYLASE in the last 168 hours. No results for input(s): AMMONIA in the last 168 hours. CBC:  Recent Labs Lab 10/22/15 0030  WBC 14.5*  HGB 14.2  HCT 43.7  MCV 85.5  PLT 267   Cardiac Enzymes: No results for input(s): CKTOTAL, CKMB, CKMBINDEX, TROPONINI in the last 168 hours.  BNP (last 3 results) No results for input(s): BNP in the last 8760 hours.  ProBNP (last 3 results) No results for input(s): PROBNP in the last 8760 hours.  CBG: No results for input(s): GLUCAP in the last 168 hours.  Radiological Exams on Admission: Dg Chest 2 View  10/22/2015  CLINICAL DATA:  82 year old male with shortness of breath EXAM: CHEST  2 VIEW COMPARISON:  Radiograph dated 08/12/2014 FINDINGS: Two views of the chest demonstrate emphysematous changes of the lungs. There is no focal consolidation, pleural effusion, or pneumothorax. Stable cardiac silhouette. No acute osseous pathology. IMPRESSION: No active cardiopulmonary disease. Electronically Signed   By: Elgie Collard M.D.   On: 10/22/2015 02:33    EKG: Independently reviewed. Sinus tachycardia. Nonspecific ST changes.  Assessment/Plan Principal Problem:   Acute respiratory failure with hypoxia (HCC) Active Problems:   HTN (hypertension)   COPD exacerbation (HCC)   Diabetes mellitus type 2, uncontrolled (HCC)   1. Acute respiratory failure with hypoxia - suspect most likely from COPD exacerbation. But however given the acute onset suspect patient may also have a common  pattern of CHF. Patient's 2-D echo done in 2015 shows EF of 65-70% with grade 1 diastolic dysfunction. At this time I have ordered 1 dose of Lasix 20 mg IV and we will continue with nebulizer (Xopenex and patient is mildly tachycardic) Pulmicort and IV steroids and Levaquin. Check ABG and also d-dimer (since acute onset). Cycle cardiac markers check 2-D echo. Check influenza PCR. 2. Diabetes mellitus type 2 uncontrolled - patient states his blood sugar has been running at around 200-300. Since patient is also going to be on IV steroids have placed patient on Lantus 10 units subcutaneous with moderate dose sliding scale coverage and continue patient's Amaryl. Check hemoglobin A1c and repeat metabolic panel. 3. Hypertension on amlodipine. 4. Hyperlipidemia on statins.   DVT Prophylaxis Lovenox.  Code Status: DO NOT RESUSCITATE.  Family Communication: Discussed with patient.  Disposition Plan: Admit to inpatient.    Czarina Gingras N. [facility] Pager [phone].  If 7PM-7AM, please contact night-coverage [email or web address] Password [facility] 10/22/2015, 6:03 AM

## 2015-10-22 NOTE — Progress Notes (Signed)
  Echocardiogram 2D Echocardiogram has been performed.  Cathie Beams 10/22/2015, 3:43 PM

## 2015-10-22 NOTE — ED Notes (Signed)
Pt arrived via EMS c/o SOB for the last hour.  Pt wears 2L O2 at home, has home health aide.  EMS gave  albuterol, 0.5mg  atrovent,  solumedrol.

## 2015-10-23 ENCOUNTER — Inpatient Hospital Stay (HOSPITAL_COMMUNITY): Payer: Medicare Other

## 2015-10-23 DIAGNOSIS — E119 Type 2 diabetes mellitus without complications: Secondary | ICD-10-CM

## 2015-10-23 DIAGNOSIS — N179 Acute kidney failure, unspecified: Secondary | ICD-10-CM

## 2015-10-23 DIAGNOSIS — Z794 Long term (current) use of insulin: Secondary | ICD-10-CM

## 2015-10-23 DIAGNOSIS — R609 Edema, unspecified: Secondary | ICD-10-CM

## 2015-10-23 LAB — CBC
HEMATOCRIT: 37.6 % — AB (ref 39.0–52.0)
HEMOGLOBIN: 12 g/dL — AB (ref 13.0–17.0)
MCH: 27.1 pg (ref 26.0–34.0)
MCHC: 31.9 g/dL (ref 30.0–36.0)
MCV: 84.9 fL (ref 78.0–100.0)
PLATELETS: 235 10*3/uL (ref 150–400)
RBC: 4.43 MIL/uL (ref 4.22–5.81)
RDW: 14.6 % (ref 11.5–15.5)
WBC: 20 10*3/uL — AB (ref 4.0–10.5)

## 2015-10-23 LAB — RESPIRATORY VIRUS PANEL
ADENOVIRUS: NEGATIVE
INFLUENZA B 1: NEGATIVE
Influenza A: NEGATIVE
METAPNEUMOVIRUS: NEGATIVE
PARAINFLUENZA 3 A: NEGATIVE
Parainfluenza 1: NEGATIVE
Parainfluenza 2: NEGATIVE
RESPIRATORY SYNCYTIAL VIRUS A: NEGATIVE
RHINOVIRUS: NEGATIVE
Respiratory Syncytial Virus B: NEGATIVE

## 2015-10-23 LAB — GLUCOSE, CAPILLARY
GLUCOSE-CAPILLARY: 239 mg/dL — AB (ref 65–99)
GLUCOSE-CAPILLARY: 282 mg/dL — AB (ref 65–99)
Glucose-Capillary: 334 mg/dL — ABNORMAL HIGH (ref 65–99)
Glucose-Capillary: 259 mg/dL — ABNORMAL HIGH (ref 65–99)

## 2015-10-23 LAB — TSH: TSH: 0.351 u[IU]/mL (ref 0.350–4.500)

## 2015-10-23 LAB — BASIC METABOLIC PANEL
ANION GAP: 10 (ref 5–15)
BUN: 25 mg/dL — ABNORMAL HIGH (ref 6–20)
CHLORIDE: 107 mmol/L (ref 101–111)
CO2: 24 mmol/L (ref 22–32)
Calcium: 8.6 mg/dL — ABNORMAL LOW (ref 8.9–10.3)
Creatinine, Ser: 1.22 mg/dL (ref 0.61–1.24)
GFR calc Af Amer: >60 mL/min (ref >60)
GFR, EST NON AFRICAN AMERICAN: 53 mL/min — AB (ref >60)
GLUCOSE: 259 mg/dL — AB (ref 65–99)
POTASSIUM: 4.3 mmol/L (ref 3.5–5.1)
Sodium: 141 mmol/L (ref 135–145)

## 2015-10-23 LAB — HEMOGLOBIN A1C
HEMOGLOBIN A1C: 10.9 % — AB (ref 4.8–5.6)
MEAN PLASMA GLUCOSE: 266 mg/dL

## 2015-10-23 LAB — MAGNESIUM: Magnesium: 2 mg/dL (ref 1.7–2.4)

## 2015-10-23 MED ORDER — PREDNISONE 50 MG PO TABS
60.0000 mg | ORAL_TABLET | Freq: Every day | ORAL | Status: AC
  Administered 2015-10-24: 60 mg via ORAL
  Filled 2015-10-23: qty 1

## 2015-10-23 MED ORDER — ZOLPIDEM TARTRATE 5 MG PO TABS
5.0000 mg | ORAL_TABLET | Freq: Every evening | ORAL | Status: DC | PRN
  Administered 2015-10-23 – 2015-10-24 (×2): 5 mg via ORAL
  Filled 2015-10-23 (×2): qty 1

## 2015-10-23 MED ORDER — INSULIN GLARGINE 100 UNIT/ML ~~LOC~~ SOLN
20.0000 [IU] | Freq: Every day | SUBCUTANEOUS | Status: DC
  Filled 2015-10-23: qty 0.2

## 2015-10-23 MED ORDER — PREDNISONE 50 MG PO TABS
50.0000 mg | ORAL_TABLET | Freq: Every day | ORAL | Status: AC
  Administered 2015-10-25: 50 mg via ORAL
  Filled 2015-10-23: qty 1

## 2015-10-23 NOTE — Progress Notes (Signed)
VASCULAR LAB PRELIMINARY  PRELIMINARY  PRELIMINARY  PRELIMINARY  Right lower extremity venous duplex completed.    Right:  No evidence of DVT, superficial thrombosis, or Baker's cyst.   Jenetta Loges, RVT, RDMS 10/23/2015, 3:33 PM

## 2015-10-23 NOTE — Evaluation (Signed)
Physical Therapy Evaluation Patient Details Name: Alejandro Hall MRN: [id number] DOB: 08-May-1929 Today's Date: 10/23/2015   History of Present Illness  Patient is a 82 y/o male with hx of COPD, HTN, DM, seizures and HLD presents with SOB. CXR-unremarkable. Elevated sugars. Admitted with Acute on chronic respiratory failure with hypoxia from COPD exacerbation/diastolic chf  Clinical Impression  Patient presents with dyspnea on exertion, decreased balance, strength and endurance impacting safe mobility. Tolerated gait training with min guard-Min A for balance/safety depending on if pt used RW. Education re: performing short bouts of activity with longer rest breaks at home for energy conservation. May need RW at d/c pending improvement. Encouraged ambulation with RN daily. Will follow acutely to maximize independence and mobility prior to return home.     Follow Up Recommendations Home health PT;Supervision - Intermittent    Equipment Recommendations  Other (comment) (RW pending progress)    Recommendations for Other Services OT consult     Precautions / Restrictions Precautions Precautions: Fall Restrictions Weight Bearing Restrictions: No      Mobility  Bed Mobility Overal bed mobility: Needs Assistance Bed Mobility: Supine to Sit     Supine to sit: Supervision;HOB elevated     General bed mobility comments: No assist needed. Increased time.   Transfers Overall transfer level: Needs assistance Equipment used: Rolling walker (2 wheeled) Transfers: Sit to/from Stand Sit to Stand: Supervision         General transfer comment: Supervision for safety. Stood from [employer], from toilet x1, from chair x1. Transferred to chair post ambulation bout.  Ambulation/Gait Ambulation/Gait assistance: Min guard;Min assist Ambulation Distance (Feet): 150 Feet (x2 bouts) Assistive device: Rolling walker (2 wheeled) Gait Pattern/deviations: Step-through pattern;Decreased stride  length;Drifts right/left     General Gait Details: Mostly steady gait wtih drifting noted during head turns. 3/4 DOE. HR up to 128 bpm, Sp02 >94% on 3L/min 02; RR up to 38 max. Good demo of pursed lip breathing. Ambulated within room with Min A without RW.   Stairs            Wheelchair Mobility    Modified Rankin (Stroke Patients Only)       Balance Overall balance assessment: Needs assistance Sitting-balance support: Feet supported;No upper extremity supported Sitting balance-Leahy Scale: Good     Standing balance support: During functional activity Standing balance-Leahy Scale: Fair Standing balance comment: Able to perform pericare without difficulty.                              Pertinent Vitals/Pain Pain Assessment: No/denies pain    Home Living Family/patient expects to be discharged to:: Private residence Living Arrangements: Children (with son who works) Available Help at Discharge: Family;Available PRN/intermittently Type of Home: House Home Access: Stairs to enter Entrance Stairs-Rails: Right Entrance Stairs-Number of Steps: 7 Home Layout: One level Home Equipment: Cane - single point Additional Comments: home 02    Prior Function Level of Independence: Independent;Needs assistance   Gait / Transfers Assistance Needed: No falls reported. Does not drive anymore.  ADL's / Homemaking Assistance Needed: Reports unable to clean his floor or mop due to dyspnea.         Hand Dominance   Dominant Hand: Right    Extremity/Trunk Assessment   Upper Extremity Assessment: Defer to OT evaluation           Lower Extremity Assessment: Generalized weakness  Communication   Communication: No difficulties  Cognition Arousal/Alertness: Awake/alert Behavior During Therapy: WFL for tasks assessed/performed Overall Cognitive Status: Within Functional Limits for tasks assessed                      General Comments       Exercises        Assessment/Plan    PT Assessment Patient needs continued PT services  PT Diagnosis Difficulty walking   PT Problem List Decreased strength;Cardiopulmonary status limiting activity;Decreased activity tolerance;Decreased balance;Decreased mobility  PT Treatment Interventions Balance training;Gait training;Stair training;Functional mobility training;Therapeutic activities;Therapeutic exercise;Patient/family education   PT Goals (Current goals can be found in the Care Plan section) Acute Rehab PT Goals Patient Stated Goal: to be able to breathe and feel better PT Goal Formulation: With patient Time For Goal Achievement: 11/06/15 Potential to Achieve Goals: Good    Frequency Min 3X/week   Barriers to discharge Inaccessible home environment 7 steps to enter home    Co-evaluation               End of Session Equipment Utilized During Treatment: Gait belt;Oxygen Activity Tolerance: Patient tolerated treatment well Patient left: in chair;with call bell/phone within reach Nurse Communication: Mobility status         Time: 0939-1006 PT Time Calculation (min) (ACUTE ONLY): 27 min   Charges:   PT Evaluation $PT Eval Moderate Complexity: 1 Procedure PT Treatments $Gait Training: 8-22 mins   PT G Codes:        Kirstein Baxley A Julieanne Hadsall 10/23/2015, 11:30 AM Mylo Red, PT, DPT [phone]

## 2015-10-23 NOTE — Progress Notes (Signed)
Utilization Review Completed.  

## 2015-10-23 NOTE — Progress Notes (Signed)
PROGRESS NOTE  Alejandro Hall:[id number] DOB: July 06, 1929 DOA: 10/22/2015 PCP: Lillia Mountain, MD  HPI/Recap of past 24 hours:  Off insulin drip, Blood sugar still elevated, breathing better, not able to talk in full sentences.   Assessment/Plan: Principal Problem:   Acute respiratory failure with hypoxia (HCC) Active Problems:   HTN (hypertension)   COPD exacerbation (HCC)   Diabetes mellitus type 2, uncontrolled (HCC)   1. Acute on chronic respiratory failure with hypoxia (on home o2 2liters)-  Required 6liter o2 initially, From COPD exacerbation/diastolic chf? s/p Lasix 20 mg IV on admission, continue nebulizer/ IV steroids and Levaquin.  d-dimer wnl. cardiac markers negative. 2-D echo result noted. Rapid influenza PCR negative, respiratory viral panel pending.  2. Diabetes mellitus type 2 uncontrolled -become significantly elevated, started insulin drip on day of admission. hemoglobin A1c 10.9 , now transitioned to sub Q insulin, need to continue adjust dose while taper steroids. 3. Hypertension on amlodipine. 4. Hyperlipidemia on statins. 5. AKI on CKDII, baseline cr around 1 in 2015, today 1.56. ua no infection, renal dosing meds, repeat labs in am, cr improving    Code Status: DNR  Family Communication: patient   Disposition Plan: likely able to move out stepdown 3/1 , then home in 1-2 days   Consultants:  none  Procedures:  none  Antibiotics:  levaquin   Objective: BP 147/64 mmHg  Pulse 94  Temp(Src) 97.7 F (36.5 C) (Oral)  Resp 24  Ht  (1.727 m)  Wt 87.8 kg (193 lb 9 oz)  BMI 29.44 kg/m2  SpO2 100%  Intake/Output Summary (Last 24 hours) at 10/23/15 2220 Last data filed at 10/23/15 2000  Gross per 24 hour  Intake    820 ml  Output   1600 ml  Net   -780 ml   Filed Weights   10/22/15 0031 10/22/15 1619 10/23/15 0500  Weight: 87.998 kg (194 lb) 89.3 kg (196 lb 13.9 oz) 87.8 kg (193 lb 9 oz)    Exam:   General:  Sob, able to  talk in full sentences   Cardiovascular: less sinus tachcardia  Respiratory:  diffuse bilateral wheezing , slight improved aeration.   Abdomen: Soft/ND/NT, positive BS  Musculoskeletal:  Edema right lower extremity better  Neuro: aaox3  Skin: scattered dark mole, back lesion (reported s/p mole resection recently)  Data Reviewed: Basic Metabolic Panel:  Recent Labs Lab 10/22/15 0030 10/22/15 0621 10/23/15 0237  NA 139 141 141  K 3.8 3.4* 4.3  CL 101 104 107  CO2 24 19* 24  GLUCOSE 468* 523* 259*  BUN 20 22* 25*  CREATININE 1.17 1.56* 1.22  CALCIUM 9.4 9.1 8.6*  MG  --   --  2.0   Liver Function Tests:  Recent Labs Lab 10/22/15 0621  AST 31  ALT 19  ALKPHOS 51  BILITOT 0.5  PROT 7.2  ALBUMIN 4.1   No results for input(s): LIPASE, AMYLASE in the last 168 hours. No results for input(s): AMMONIA in the last 168 hours. CBC:  Recent Labs Lab 10/22/15 0030 10/22/15 0621 10/23/15 0237  WBC 14.5* 11.8* 20.0*  NEUTROABS  --  10.9*  --   HGB 14.2 12.8* 12.0*  HCT 43.7 40.1 37.6*  MCV 85.5 86.1 84.9  PLT 267 266 235   Cardiac Enzymes:    Recent Labs Lab 10/22/15 0621 10/22/15 1626 10/22/15 2157  TROPONINI <0.03 <0.03 0.03   BNP (last 3 results)  Recent Labs  10/22/15 0635  BNP 30.1  ProBNP (last 3 results) No results for input(s): PROBNP in the last 8760 hours.  CBG:  Recent Labs Lab 10/22/15 2206 10/23/15 0748 10/23/15 1151 10/23/15 1716 10/23/15 2146  GLUCAP 220* 239* 282* 334* 259*    Recent Results (from the past 240 hour(s))  MRSA PCR Screening     Status: None   Collection Time: 10/22/15  5:20 PM  Result Value Ref Range Status   MRSA by PCR NEGATIVE NEGATIVE Final    Comment:        The GeneXpert MRSA Assay (FDA approved for NASAL specimens only), is one component of a comprehensive MRSA colonization surveillance program. It is not intended to diagnose MRSA infection nor to guide or monitor treatment for MRSA  infections.      Studies: No results found.  Scheduled Meds: . amLODipine  10 mg Oral Daily  . atorvastatin  10 mg Oral Daily  . budesonide (PULMICORT) nebulizer solution  0.25 mg Nebulization BID  . enoxaparin (LOVENOX) injection  40 mg Subcutaneous Q24H  . insulin aspart  0-20 Units Subcutaneous TID WC  . insulin aspart  0-5 Units Subcutaneous TID WC  . insulin glargine  10 Units Subcutaneous QHS  . ipratropium  0.5 mg Nebulization Q6H  . levalbuterol  0.63 mg Nebulization Q6H  . levofloxacin (LEVAQUIN) IV  500 mg Intravenous Q24H  . methylPREDNISolone (SOLU-MEDROL) injection  40 mg Intravenous Q12H  . sodium chloride flush  3 mL Intravenous Q12H    Continuous Infusions:     Time spent:   Jemya Depierro MD, PhD  [facility] Pager [phone]. If 7PM-7AM, please contact night-coverage at [email or web address], password [facility] 10/23/2015, 10:20 PM  LOS: 1 day

## 2015-10-23 NOTE — Progress Notes (Signed)
Neb given pt is stable at this time  Resting.

## 2015-10-24 DIAGNOSIS — E119 Type 2 diabetes mellitus without complications: Secondary | ICD-10-CM | POA: Insufficient documentation

## 2015-10-24 LAB — BASIC METABOLIC PANEL
ANION GAP: 10 (ref 5–15)
BUN: 27 mg/dL — ABNORMAL HIGH (ref 6–20)
CO2: 26 mmol/L (ref 22–32)
Calcium: 8.3 mg/dL — ABNORMAL LOW (ref 8.9–10.3)
Chloride: 103 mmol/L (ref 101–111)
Creatinine, Ser: 1.09 mg/dL (ref 0.61–1.24)
GFR calc Af Amer: >60 mL/min (ref >60)
GLUCOSE: 316 mg/dL — AB (ref 65–99)
POTASSIUM: 4.5 mmol/L (ref 3.5–5.1)
SODIUM: 139 mmol/L (ref 135–145)

## 2015-10-24 LAB — GLUCOSE, CAPILLARY
GLUCOSE-CAPILLARY: 242 mg/dL — AB (ref 65–99)
GLUCOSE-CAPILLARY: 287 mg/dL — AB (ref 65–99)
Glucose-Capillary: 203 mg/dL — ABNORMAL HIGH (ref 65–99)
Glucose-Capillary: 309 mg/dL — ABNORMAL HIGH (ref 65–99)

## 2015-10-24 LAB — CBC
HCT: 39.8 % (ref 39.0–52.0)
Hemoglobin: 12.5 g/dL — ABNORMAL LOW (ref 13.0–17.0)
MCH: 27.1 pg (ref 26.0–34.0)
MCHC: 31.4 g/dL (ref 30.0–36.0)
MCV: 86.3 fL (ref 78.0–100.0)
PLATELETS: 259 10*3/uL (ref 150–400)
RBC: 4.61 MIL/uL (ref 4.22–5.81)
RDW: 14.9 % (ref 11.5–15.5)
WBC: 15.8 10*3/uL — AB (ref 4.0–10.5)

## 2015-10-24 MED ORDER — INSULIN STARTER KIT- SYRINGES (ENGLISH)
1.0000 | Freq: Once | Status: AC
  Administered 2015-10-24: 1
  Filled 2015-10-24: qty 1

## 2015-10-24 MED ORDER — BUDESONIDE 0.25 MG/2ML IN SUSP
0.2500 mg | Freq: Two times a day (BID) | RESPIRATORY_TRACT | Status: DC
  Administered 2015-10-24 – 2015-10-26 (×5): 0.25 mg via RESPIRATORY_TRACT
  Filled 2015-10-24 (×5): qty 2

## 2015-10-24 MED ORDER — LEVOFLOXACIN 500 MG PO TABS
500.0000 mg | ORAL_TABLET | Freq: Every day | ORAL | Status: DC
  Administered 2015-10-25 – 2015-10-26 (×2): 500 mg via ORAL
  Filled 2015-10-24 (×3): qty 1

## 2015-10-24 MED ORDER — INSULIN GLARGINE 100 UNIT/ML ~~LOC~~ SOLN
24.0000 [IU] | Freq: Every day | SUBCUTANEOUS | Status: DC
  Administered 2015-10-24: 24 [IU] via SUBCUTANEOUS
  Filled 2015-10-24 (×3): qty 0.24

## 2015-10-24 MED ORDER — SORBITOL 70 % SOLN
30.0000 mL | Freq: Once | Status: AC
  Administered 2015-10-24: 30 mL via ORAL
  Filled 2015-10-24: qty 30

## 2015-10-24 MED ORDER — BUDESONIDE 0.5 MG/2ML IN SUSP: 0.2500 mg | Freq: Two times a day (BID) | RESPIRATORY_TRACT | Status: DC

## 2015-10-24 MED ORDER — PANTOPRAZOLE SODIUM 40 MG PO TBEC
40.0000 mg | DELAYED_RELEASE_TABLET | Freq: Every day | ORAL | Status: DC
  Administered 2015-10-24 – 2015-10-26 (×3): 40 mg via ORAL
  Filled 2015-10-24 (×3): qty 1

## 2015-10-24 MED ORDER — LIVING WELL WITH DIABETES BOOK
Freq: Once | Status: AC
  Administered 2015-10-24: 15:00:00
  Filled 2015-10-24: qty 1

## 2015-10-24 MED ORDER — SENNOSIDES-DOCUSATE SODIUM 8.6-50 MG PO TABS
1.0000 | ORAL_TABLET | Freq: Two times a day (BID) | ORAL | Status: DC
  Administered 2015-10-24 – 2015-10-26 (×4): 1 via ORAL
  Filled 2015-10-24 (×5): qty 1

## 2015-10-24 MED ORDER — FLUTICASONE PROPIONATE 50 MCG/ACT NA SUSP
2.0000 | Freq: Every day | NASAL | Status: DC
  Administered 2015-10-24 – 2015-10-26 (×3): 2 via NASAL
  Filled 2015-10-24: qty 16

## 2015-10-24 MED ORDER — GUAIFENESIN ER 600 MG PO TB12
1200.0000 mg | ORAL_TABLET | Freq: Two times a day (BID) | ORAL | Status: DC
  Administered 2015-10-24 – 2015-10-26 (×5): 1200 mg via ORAL
  Filled 2015-10-24 (×5): qty 2

## 2015-10-24 MED ORDER — LORATADINE 10 MG PO TABS
10.0000 mg | ORAL_TABLET | Freq: Every day | ORAL | Status: DC
  Administered 2015-10-24 – 2015-10-26 (×3): 10 mg via ORAL
  Filled 2015-10-24 (×3): qty 1

## 2015-10-24 MED ORDER — ARFORMOTEROL TARTRATE 15 MCG/2ML IN NEBU
15.0000 ug | INHALATION_SOLUTION | Freq: Two times a day (BID) | RESPIRATORY_TRACT | Status: DC
  Administered 2015-10-24 – 2015-10-26 (×3): 15 ug via RESPIRATORY_TRACT
  Filled 2015-10-24 (×4): qty 2

## 2015-10-24 NOTE — Care Management Note (Signed)
Case Management Note  Patient Details  Name: Alejandro Hall MRN: [id number] Date of Birth: 01/29/1929  Subjective/Objective:     Pt lives with son, has [facility] with [facility].  Reports problems with O2 PTA, [facility] will arrange home visit to check equipment.  PT recommends home therapy, pt will also need RN for DM management.  Pt chose [facility] after being provided with list of agencies.                           Expected Discharge Plan:  Home w Home Health Services  Discharge planning Services  CM Consult  HH Arranged:  RN, PT [facility] Agency:  [facility]  Status of Service:  In process, will continue to follow  Magdalene River, RN 10/24/2015, 2:14 PM

## 2015-10-24 NOTE — Progress Notes (Addendum)
Inpatient Diabetes Program Recommendations  AACE/ADA: New Consensus Statement on Inpatient Glycemic Control (2015)  Target Ranges:  Prepandial:   less than 140 mg/dL      Peak postprandial:   less than 180 mg/dL (1-2 hours)      Critically ill patients:  140 - 180 mg/dL   Review of Glycemic Control  Diabetes history: DM 2 Outpatient Diabetes medications: Amaryl 2 mg daily (per patient) Current orders for Inpatient glycemic control: Lantus 24 units (increaed to start tonight) and resistant correction tidwc and HS correction scale.  Inpatient Diabetes Program Recommendations:    Referral received for dm education and control. Spoke with patient at length regarding his glucose control at home. Patient states he has been checking his blood sugars at home at different times-some fasting, and some immediately after eating. Patient states his levels are anywhere from mid-200's to 400's. States he started the amaryl but no improvement since that time. Told patient that he needs to use insulin at home, but he was hesitant to do that. Explained to him his A1C status and how harmful this is both acutely and chronically. He states he used to give his wife her insulin and never liked doing it.  He truly needs to be on lantus or levemir at 25 units (lowest dose at 0.2 units/kg). May need a correction scale as well of novolog if he will agree. He knows how to use the vial and syring perfectly (as he has done with wife). Will order patient education via system ed.l videos, living well with dm book, and insulin starter kit using vial and syringe for RN's to review with patient. Patient also states he needs more strips for his meter, usually gets them from [facility] services (?)/ Has already spoken with dietician and is knowledgeable about his diet. Spoke with care management, [facility] who will try to get [facility] to assist with dm management with insulin at home.   Thank you Rosita Kea, RN, MSN, CDE  Diabetes Inpatient  Program Office: [phone] Pager: [phone] 8:00 am to 5:00 pm

## 2015-10-24 NOTE — Progress Notes (Signed)
[facility] PROGRESS NOTE  Alejandro Hall WGN:[id number] DOB: Jan 09, 1929 DOA: 10/22/2015 PCP: Lillia Mountain, MD  Assessment/Plan: #1 acute on chronic respiratory failure with hypoxia Patient on 2.5 L nasal calvarial home O2. Patient initially required 6 L nasal cannula. Secondary to an acute COPD exacerbation/?? Diastolic CHF. Patient received Lasix 20 mg IV on admission and is -4.1 L during this hospitalization. 2-D echo with normal EF and no wall motion abnormalities. Current weight is 87.6 kg from 87.8 kg 10/23/2015 from 89.3 kg 10/22/2015. Continue steroid taper, Levaquin, Pulmicort, scheduled nebulizers. Will add Brovana, claritin, Mucinex, PPI,flonase. Chest PT. Follow.  #2 acute COPD exacerbation Clinical improvement. On admission patient requires 6 L nasal cannula of oxygen. Currently on 2.5 L nasal cannula. Continue oxygen, steroid taper, Levaquin, Pulmicort, scheduled nebulizers. Will add Brovana, Claritin, PPI, Flonase. Chest PT. Patient will need to follow-up with pulmonary as outpatient.  #3 poorly controlled type 2 diabetes mellitus On admission patient had elevated CBGs and had to be placed on insulin drip. Hemoglobin A1c is 10.9. Patient now on subcutaneous insulin. CBGs have ranged from 242-334. Increase Lantus to 24 units daily. Continue sliding scale insulin. Diabetes coordinator.  #4 acute on chronic kidney disease stage II Baseline creatinine around 1 in 2015. Renal function trending down and currently at 1.09. Follow.  #5 hypertension Stable. Continue Norvasc.  #6 hyperlipidemia Check a fasting lipid panel. Continue Lipitor.  #7 prophylaxis PPI for GI prophylaxis. Lovenox for DVT prophylaxis.  Code Status: DO NOT RESUSCITATE Family Communication: Updated patient. No family at bedside. Disposition Plan: Remaining stepdown unit today.Transfer to telemetry floor tomorrow.   Consultants:  None  Procedures:  2-D echo 10/22/2015   Lower extremity Dopplers  10/23/2015  Chest x-ray 10/22/2015  Antibiotics:  Oral Levaquin 10/25/2015  IV Levaquin 10/22/2015>>>> 10/24/2015  HPI/Subjective: Patient states shortness of breath improving since admission. Patient states on 2.5 L nasal cannula home O2. Patient denies any chest pain. Patient with a cough.  Objective: Filed Vitals:   10/24/15 0600 10/24/15 0836  BP: 108/47 132/70  Pulse: 68 89  Temp:  98.6 F (37 C)  Resp: 20 34    Intake/Output Summary (Last 24 hours) at 10/24/15 0951 Last data filed at 10/24/15 0735  Gross per 24 hour  Intake    580 ml  Output   1700 ml  Net  -1120 ml   Filed Weights   10/22/15 1619 10/23/15 0500 10/24/15 0500  Weight: 89.3 kg (196 lb 13.9 oz) 87.8 kg (193 lb 9 oz) 87.6 kg (193 lb 2 oz)    Exam:   General:  Sitting up in bed. Some SOB with talking  Cardiovascular: RRR  Respiratory: Poor to fair air movement. Minimal expiratory wheezing upper lobes.Less tight. No crackles.  Abdomen: Protuberant, positive bowel sounds, soft, nontender to palpation,  Musculoskeletal: No clubbing cyanosis or edema.  Data Reviewed: Basic Metabolic Panel:  Recent Labs Lab 10/22/15 0030 10/22/15 0621 10/23/15 0237 10/24/15 0501  NA 139 141 141 139  K 3.8 3.4* 4.3 4.5  CL 101 104 107 103  CO2 24 19* 24 26  GLUCOSE 468* 523* 259* 316*  BUN 20 22* 25* 27*  CREATININE 1.17 1.56* 1.22 1.09  CALCIUM 9.4 9.1 8.6* 8.3*  MG  --   --  2.0  --    Liver Function Tests:  Recent Labs Lab 10/22/15 0621  AST 31  ALT 19  ALKPHOS 51  BILITOT 0.5  PROT 7.2  ALBUMIN 4.1   No results for  input(s): LIPASE, AMYLASE in the last 168 hours. No results for input(s): AMMONIA in the last 168 hours. CBC:  Recent Labs Lab 10/22/15 0030 10/22/15 0621 10/23/15 0237 10/24/15 0501  WBC 14.5* 11.8* 20.0* 15.8*  NEUTROABS  --  10.9*  --   --   HGB 14.2 12.8* 12.0* 12.5*  HCT 43.7 40.1 37.6* 39.8  MCV 85.5 86.1 84.9 86.3  PLT 267 266 235 259   Cardiac  Enzymes:  Recent Labs Lab 10/22/15 0621 10/22/15 1626 10/22/15 2157  TROPONINI <0.03 <0.03 0.03   BNP (last 3 results)  Recent Labs  10/22/15 0635  BNP 30.1    ProBNP (last 3 results) No results for input(s): PROBNP in the last 8760 hours.  CBG:  Recent Labs Lab 10/23/15 0748 10/23/15 1151 10/23/15 1716 10/23/15 2146 10/24/15 0838  GLUCAP 239* 282* 334* 259* 242*    Recent Results (from the past 240 hour(s))  Respiratory virus panel     Status: None   Collection Time: 10/22/15  9:40 AM  Result Value Ref Range Status   Respiratory Syncytial Virus A Negative Negative Final   Respiratory Syncytial Virus B Negative Negative Final   Influenza A Negative Negative Final   Influenza B Negative Negative Final   Parainfluenza 1 Negative Negative Final   Parainfluenza 2 Negative Negative Final   Parainfluenza 3 Negative Negative Final   Metapneumovirus Negative Negative Final   Rhinovirus Negative Negative Final   Adenovirus Negative Negative Final    Comment: (NOTE) Performed At: [facility] [address] [place], [place] [id number] Mila Homer MD WU:[id number]   MRSA PCR Screening     Status: None   Collection Time: 10/22/15  5:20 PM  Result Value Ref Range Status   MRSA by PCR NEGATIVE NEGATIVE Final    Comment:        The GeneXpert MRSA Assay (FDA approved for NASAL specimens only), is one component of a comprehensive MRSA colonization surveillance program. It is not intended to diagnose MRSA infection nor to guide or monitor treatment for MRSA infections.      Studies: No results found.  Scheduled Meds: . amLODipine  10 mg Oral Daily  . atorvastatin  10 mg Oral Daily  . budesonide (PULMICORT) nebulizer solution  0.25 mg Nebulization BID  . enoxaparin (LOVENOX) injection  40 mg Subcutaneous Q24H  . insulin aspart  0-20 Units Subcutaneous TID WC  . insulin aspart  0-5 Units Subcutaneous TID WC  . insulin glargine  24 Units  Subcutaneous QHS  . ipratropium  0.5 mg Nebulization Q6H  . levalbuterol  0.63 mg Nebulization Q6H  . [START ON 10/25/2015] levofloxacin  500 mg Oral Daily  . [START ON 10/25/2015] predniSONE  50 mg Oral Q breakfast  . sodium chloride flush  3 mL Intravenous Q12H   Continuous Infusions:   Principal Problem:   Acute respiratory failure with hypoxia (HCC) Active Problems:   HTN (hypertension)   COPD exacerbation (HCC)   Diabetes mellitus type 2, uncontrolled (HCC)    Time spent: 40 mins    [facility] MD [facility] Pager [phone]. If 7PM-7AM, please contact night-coverage at [email or web address], password [facility] 10/24/2015, 9:51 AM  LOS: 2 days

## 2015-10-24 NOTE — Progress Notes (Signed)
Physical Therapy Treatment Patient Details Name: Alejandro Hall MRN: [id number] DOB: [birth year] Today's Date: 10/24/2015    History of Present Illness Patient is a 82 y/o male with hx of COPD, HTN, DM, seizures and HLD presents with SOB. CXR-unremarkable. Elevated sugars. Admitted with Acute on chronic respiratory failure with hypoxia from COPD exacerbation/diastolic chf    PT Comments    Pt able to progress mobility today and utilized rollator for ambulation.  Pt ed on energy conservation with mobility and activities at home.  Feel pt should continue to progress mobility and be able to return to home.    Follow Up Recommendations  Home health PT;Supervision - Intermittent     Equipment Recommendations   (Rollator)    Recommendations for Other Services       Precautions / Restrictions Precautions Precautions: Fall Restrictions Weight Bearing Restrictions: No    Mobility  Bed Mobility               General bed mobility comments: pt sitting in recliner.  Transfers Overall transfer level: Needs assistance Equipment used: 4-wheeled walker Transfers: Sit to/from Stand Sit to Stand: Supervision         General transfer comment: Cues for UE use and not pulling from rollator.    Ambulation/Gait Ambulation/Gait assistance: Min guard Ambulation Distance (Feet): 180 Feet Assistive device: 4-wheeled walker Gait Pattern/deviations: Step-through pattern;Decreased stride length     General Gait Details: Utilized rollator and discussed use for energy conservation purposes.  pt demonstrates good awareness of safety and abilities.  pt on 3L O2 throughout ambulation and O2 sats ranged 87-94% with 87% at very end of gait.     Stairs            Wheelchair Mobility    Modified Rankin (Stroke Patients Only)       Balance Overall balance assessment: Needs assistance         Standing balance support: During functional activity Standing balance-Leahy Scale: Fair                      Cognition Arousal/Alertness: Awake/alert Behavior During Therapy: WFL for tasks assessed/performed Overall Cognitive Status: Within Functional Limits for tasks assessed                      Exercises      General Comments General comments (skin integrity, edema, etc.): pt ed on energy conservation and gave handouts.      Pertinent Vitals/Pain Pain Assessment: No/denies pain    Home Living                      Prior Function            PT Goals (current goals can now be found in the care plan section) Acute Rehab PT Goals Patient Stated Goal: to be able to breathe and feel better PT Goal Formulation: With patient Time For Goal Achievement: 11/06/15 Potential to Achieve Goals: Good Progress towards PT goals: Progressing toward goals    Frequency  Min 3X/week    PT Plan Current plan remains appropriate    Co-evaluation             End of Session Equipment Utilized During Treatment: Gait belt;Oxygen Activity Tolerance: Patient tolerated treatment well Patient left: in chair;with call bell/phone within reach;with chair alarm set     Time: 7829-5621 PT Time Calculation (min) (ACUTE ONLY): 24 min  Charges:  $Gait Training: 8-22  mins $Self Care/Home Management: 2023-04-18                    G CodesSunny Schlein, [place] [phone] 10/24/2015, 3:02 PM

## 2015-10-24 NOTE — Progress Notes (Signed)
Inpatient Diabetes Program Recommendations  AACE/ADA: New Consensus Statement on Inpatient Glycemic Control (2015)  Target Ranges:  Prepandial:   less than 140 mg/dL      Peak postprandial:   less than 180 mg/dL (1-2 hours)      Critically ill patients:  140 - 180 mg/dL   Review of Glycemic Control  Inpatient Diabetes Program Recommendations:    Patient now on prednisone 60 mg daily. Lantus increased to 24 units to start tonight.   Prednisone typically increases need for meal coverage.Patient would benefit from novolog meal coverage in addition to correction. Recommend 4 units meal coverage tidwc and decrease correction to moderate tidwc and HS scales.  Will talk with patient and assess educational needs.  Thank you Lenor Coffin, RN, MSN, CDE  Diabetes Inpatient Program Office: [phone] Pager: [phone] 8:00 am to 5:00 pm

## 2015-10-25 DIAGNOSIS — J9601 Acute respiratory failure with hypoxia: Secondary | ICD-10-CM

## 2015-10-25 LAB — BASIC METABOLIC PANEL
ANION GAP: 10 (ref 5–15)
BUN: 26 mg/dL — ABNORMAL HIGH (ref 6–20)
CALCIUM: 8.4 mg/dL — AB (ref 8.9–10.3)
CO2: 25 mmol/L (ref 22–32)
Chloride: 104 mmol/L (ref 101–111)
Creatinine, Ser: 1.04 mg/dL (ref 0.61–1.24)
GLUCOSE: 203 mg/dL — AB (ref 65–99)
Potassium: 3.7 mmol/L (ref 3.5–5.1)
SODIUM: 139 mmol/L (ref 135–145)

## 2015-10-25 LAB — GLUCOSE, CAPILLARY
GLUCOSE-CAPILLARY: 229 mg/dL — AB (ref 65–99)
Glucose-Capillary: 136 mg/dL — ABNORMAL HIGH (ref 65–99)
Glucose-Capillary: 319 mg/dL — ABNORMAL HIGH (ref 65–99)
Glucose-Capillary: 284 mg/dL — ABNORMAL HIGH (ref 65–99)

## 2015-10-25 LAB — CBC
HCT: 43.3 % (ref 39.0–52.0)
Hemoglobin: 13.8 g/dL (ref 13.0–17.0)
MCH: 27.2 pg (ref 26.0–34.0)
MCHC: 31.9 g/dL (ref 30.0–36.0)
MCV: 85.4 fL (ref 78.0–100.0)
PLATELETS: 245 10*3/uL (ref 150–400)
RBC: 5.07 MIL/uL (ref 4.22–5.81)
RDW: 14.5 % (ref 11.5–15.5)
WBC: 15.7 10*3/uL — AB (ref 4.0–10.5)

## 2015-10-25 LAB — MAGNESIUM: MAGNESIUM: 2.3 mg/dL (ref 1.7–2.4)

## 2015-10-25 MED ORDER — FUROSEMIDE 40 MG PO TABS
40.0000 mg | ORAL_TABLET | Freq: Every day | ORAL | Status: AC
  Administered 2015-10-25 – 2015-10-26 (×2): 40 mg via ORAL
  Filled 2015-10-25 (×2): qty 1

## 2015-10-25 MED ORDER — GLIMEPIRIDE 4 MG PO TABS
4.0000 mg | ORAL_TABLET | Freq: Every day | ORAL | Status: DC
  Administered 2015-10-26: 4 mg via ORAL
  Filled 2015-10-25: qty 1

## 2015-10-25 MED ORDER — FUROSEMIDE 40 MG PO TABS
40.0000 mg | ORAL_TABLET | Freq: Every day | ORAL | Status: DC
Start: 2015-10-25 — End: 2015-10-25

## 2015-10-25 MED ORDER — PREDNISONE 20 MG PO TABS
40.0000 mg | ORAL_TABLET | Freq: Every day | ORAL | Status: DC
  Administered 2015-10-26: 40 mg via ORAL
  Filled 2015-10-25: qty 2

## 2015-10-25 MED ORDER — INSULIN GLARGINE 100 UNIT/ML ~~LOC~~ SOLN
18.0000 [IU] | Freq: Every day | SUBCUTANEOUS | Status: DC
  Administered 2015-10-25: 18 [IU] via SUBCUTANEOUS
  Filled 2015-10-25 (×3): qty 0.18

## 2015-10-25 MED ORDER — IPRATROPIUM BROMIDE 0.02 % IN SOLN
0.5000 mg | Freq: Three times a day (TID) | RESPIRATORY_TRACT | Status: DC
  Administered 2015-10-26 (×2): 0.5 mg via RESPIRATORY_TRACT
  Filled 2015-10-25 (×2): qty 2.5

## 2015-10-25 MED ORDER — LEVALBUTEROL HCL 0.63 MG/3ML IN NEBU
0.6300 mg | INHALATION_SOLUTION | Freq: Three times a day (TID) | RESPIRATORY_TRACT | Status: DC
  Administered 2015-10-26 (×2): 0.63 mg via RESPIRATORY_TRACT
  Filled 2015-10-25 (×2): qty 3

## 2015-10-25 NOTE — Procedures (Signed)
Pt declines to do CPT at this time to sleep.

## 2015-10-25 NOTE — Progress Notes (Signed)
[facility] PROGRESS NOTE  Alejandro Hall:[id number] DOB: 02-05-1929 DOA: 10/22/2015 PCP: Lillia Mountain, MD  Assessment/Plan: #1 acute on chronic respiratory failure with hypoxia improved  #2 acute COPD exacerbation Improving. Reports not at baseline and wants to stay "until Sunday when my son is available" I suspect close to baseline. Increase activity. Wean steroids. Will give another dose lasix. Home 1-2 days. Discussed that likelihood with patient. RVP negative  #3 poorly controlled type 2 diabetes mellitus Improving. Resume amaryl at higher dose. Patient reluctant to take insulin at home, but will likely be unable to avoid. Will decrease lantus to avoid hypoglycemia  #4 acute on chronic kidney disease stage II improved  #5 hypertension Stable. Continue Norvasc.  Transfer to floor  Code Status: DO NOT RESUSCITATE Family Communication: Updated patient. No family at bedside. Disposition Plan: home with PT RN 1-2 days   Consultants:  None  Procedures:  2-D echo 10/22/2015   Lower extremity Dopplers 10/23/2015  Chest x-ray 10/22/2015  Antibiotics:  Oral Levaquin 10/25/2015  IV Levaquin 10/22/2015>>>> 10/24/2015  HPI/Subjective: C/o congestion, breathing not back to baseline. Per RN, breathing stable all day.  Objective: Filed Vitals:   10/25/15 0800 10/25/15 1235  BP: 121/73 110/67  Pulse: 74 63  Temp: 97.9 F (36.6 C) 97.8 F (36.6 C)  Resp: 24 19    Intake/Output Summary (Last 24 hours) at 10/25/15 1448 Last data filed at 10/25/15 1400  Gross per 24 hour  Intake   1080 ml  Output   1625 ml  Net   -545 ml   Filed Weights   10/23/15 0500 10/24/15 0500 10/25/15 0500  Weight: 87.8 kg (193 lb 9 oz) 87.6 kg (193 lb 2 oz) 87.5 kg (192 lb 14.4 oz)    Exam:   General:  A and o. Comfortable. Speaking in full sentences  Cardiovascular: RRR without MGR  Respiratory: good air movement. Slight wheeze, slight rhonchi no ralkes  Abdomen:  Protuberant, positive bowel sounds, soft, nontender to palpation,  Musculoskeletal: No clubbing cyanosis. Right leg greater than left edema  Data Reviewed: Basic Metabolic Panel:  Recent Labs Lab 10/22/15 0030 10/22/15 0621 10/23/15 0237 10/24/15 0501 10/25/15 0400  NA 139 141 141 139 139  K 3.8 3.4* 4.3 4.5 3.7  CL 101 104 107 103 104  CO2 24 19* GLUCOSE 468* 523* 259* 316* 203*  BUN 20 22* 25* 27* 26*  CREATININE 1.17 1.56* 1.22 1.09 1.04  CALCIUM 9.4 9.1 8.6* 8.3* 8.4*  MG  --   --  2.0  --  2.3   Liver Function Tests:  Recent Labs Lab 10/22/15 0621  AST 31  ALT 19  ALKPHOS 51  BILITOT 0.5  PROT 7.2  ALBUMIN 4.1   No results for input(s): LIPASE, AMYLASE in the last 168 hours. No results for input(s): AMMONIA in the last 168 hours. CBC:  Recent Labs Lab 10/22/15 0030 10/22/15 0621 10/23/15 0237 10/24/15 0501 10/25/15 0400  WBC 14.5* 11.8* 20.0* 15.8* 15.7*  NEUTROABS  --  10.9*  --   --   --   HGB 14.2 12.8* 12.0* 12.5* 13.8  HCT 43.7 40.1 37.6* 39.8 43.3  MCV 85.5 86.1 84.9 86.3 85.4  PLT 267 266 235 259 245   Cardiac Enzymes:  Recent Labs Lab 10/22/15 0621 10/22/15 1626 10/22/15 2157  TROPONINI <0.03 <0.03 0.03   BNP (last 3 results)  Recent Labs  10/22/15 0635  BNP 30.1    ProBNP (last 3  results) No results for input(s): PROBNP in the last 8760 hours.  CBG:  Recent Labs Lab 10/24/15 1317 10/24/15 1807 10/24/15 2145 10/25/15 0759 10/25/15 1233  GLUCAP 203* 287* 309* 136* 229*    Recent Results (from the past 240 hour(s))  Respiratory virus panel     Status: None   Collection Time: 10/22/15  9:40 AM  Result Value Ref Range Status   Respiratory Syncytial Virus A Negative Negative Final   Respiratory Syncytial Virus B Negative Negative Final   Influenza A Negative Negative Final   Influenza B Negative Negative Final   Parainfluenza 1 Negative Negative Final   Parainfluenza 2 Negative Negative Final    Parainfluenza 3 Negative Negative Final   Metapneumovirus Negative Negative Final   Rhinovirus Negative Negative Final   Adenovirus Negative Negative Final    Comment: (NOTE) Performed At: [facility] [address] [place], [place] [id number] Mila Homer MD WU:[id number]   MRSA PCR Screening     Status: None   Collection Time: 10/22/15  5:20 PM  Result Value Ref Range Status   MRSA by PCR NEGATIVE NEGATIVE Final    Comment:        The GeneXpert MRSA Assay (FDA approved for NASAL specimens only), is one component of a comprehensive MRSA colonization surveillance program. It is not intended to diagnose MRSA infection nor to guide or monitor treatment for MRSA infections.      Studies: No results found.  Scheduled Meds: . amLODipine  10 mg Oral Daily  . arformoterol  15 mcg Nebulization BID  . atorvastatin  10 mg Oral Daily  . budesonide (PULMICORT) nebulizer solution  0.25 mg Nebulization BID  . enoxaparin (LOVENOX) injection  40 mg Subcutaneous Q24H  . fluticasone  2 spray Each Nare Daily  . guaiFENesin  1,200 mg Oral BID  . insulin aspart  0-20 Units Subcutaneous TID WC  . insulin aspart  0-5 Units Subcutaneous TID WC  . insulin glargine  24 Units Subcutaneous QHS  . ipratropium  0.5 mg Nebulization Q6H  . levalbuterol  0.63 mg Nebulization Q6H  . levofloxacin  500 mg Oral Daily  . loratadine  10 mg Oral Daily  . pantoprazole  40 mg Oral Q0600  . senna-docusate  1 tablet Oral BID  . sodium chloride flush  3 mL Intravenous Q12H   Continuous Infusions:   Principal Problem:   Acute respiratory failure with hypoxia (HCC) Active Problems:   HTN (hypertension)   COPD exacerbation (HCC)   Diabetes mellitus type 2, uncontrolled (HCC)   DM type 2, goal HbA1c < 7% (HCC)    Time spent: 25 mins    [name] L MD [facility]  [email or web address], password [facility] 10/25/2015, 2:48 PM  LOS: 3 days

## 2015-10-25 NOTE — Care Management Important Message (Signed)
Important Message  Patient Details  Name: Alejandro Hall MRN: [id number] Date of Birth: [birth year]   Medicare Important Message Given:  Yes    Gyan Cambre Abena 10/25/2015, 3:01 PM

## 2015-10-25 NOTE — Evaluation (Signed)
Occupational Therapy Evaluation Patient Details Name: Alejandro Hall MRN: [id number] DOB: [birth year] Today's Date: 10/25/2015    History of Present Illness Patient is a 82 y/o male with hx of COPD, HTN, DM, seizures and HLD presents with SOB. CXR-unremarkable. Elevated sugars. Admitted with Acute on chronic respiratory failure with hypoxia from COPD exacerbation/diastolic chf   Clinical Impression   Pt was independent in ADL and mobility prior to admission. He sponge bathes and is not interested in showering. Pt uses a bookcase to pull up from the toilet. Educated pt in use of 3 in 1 vs riser. Pt uses pursed lip breathing techniques routinely and is on 2.5L 02 at home.  Pt overall performing at a supervision level.  Will follow acutely. Do not anticipate pt will need post acute OT.    Follow Up Recommendations  No OT follow up    Equipment Recommendations  3 in 1 bedside comode    Recommendations for Other Services       Precautions / Restrictions Precautions Precautions: Fall Restrictions Weight Bearing Restrictions: No      Mobility Bed Mobility               General bed mobility comments: pt sitting in recliner.  Transfers Overall transfer level: Needs assistance Equipment used: 1 person hand held assist Transfers: Sit to/from Stand Sit to Stand: Supervision         General transfer comment: supervision for safety, initially unsteady    Balance     Sitting balance-Leahy Scale: Good       Standing balance-Leahy Scale: Fair                              ADL Overall ADL's : Needs assistance/impaired Eating/Feeding: Independent;Sitting   Grooming: Wash/dry hands;Standing;Min guard   Upper Body Bathing: Set up;Sitting   Lower Body Bathing: Sit to/from stand;Supervison/ safety   Upper Body Dressing : Set up;Sitting   Lower Body Dressing: Min guard;Sit to/from stand Lower Body Dressing Details (indicate cue type and reason): able to don  and doff socks without bending over Toilet Transfer: Ambulation;Supervision/safety   Toileting- Clothing Manipulation and Hygiene: Sit to/from stand;Supervision/safety       Functional mobility during ADLs: Min guard General ADL Comments: Pt with decreased activity tolerance. Educated in energy conservation. Pt uses pursed lip breathing techniques.      Vision     Perception     Praxis      Pertinent Vitals/Pain Pain Assessment: No/denies pain     Hand Dominance Right   Extremity/Trunk Assessment Upper Extremity Assessment Upper Extremity Assessment: Overall WFL for tasks assessed   Lower Extremity Assessment Lower Extremity Assessment: Defer to PT evaluation       Communication Communication Communication: No difficulties   Cognition Arousal/Alertness: Awake/alert Behavior During Therapy: WFL for tasks assessed/performed Overall Cognitive Status: Within Functional Limits for tasks assessed                     General Comments       Exercises       Shoulder Instructions      Home Living Family/patient expects to be discharged to:: Private residence Living Arrangements: Children (son works 3rd shift) Available Help at Discharge: Family;Available PRN/intermittently Type of Home: House Home Access: Stairs to enter [employer] of Steps: 7 Entrance Stairs-Rails: Right Home Layout: One level     Bathroom Shower/Tub: Tub/shower unit (pt sponge  bathes, no interest in showering)   Bathroom Toilet: Standard     Home Equipment: Cane - single point   Additional Comments: home 02, pt pulls up on a book case and towel rod from toilet      Prior Functioning/Environment Level of Independence: Needs assistance  Gait / Transfers Assistance Needed: rarely leaves home, walks without device in home, fell in tub several years ago and no longer uses shower. ADL's / Homemaking Assistance Needed: Pt is assisted for housekeeping, can prepare a meal,  sponge bathes and dresses independently        OT Diagnosis: Generalized weakness   OT Problem List: Decreased activity tolerance;Impaired balance (sitting and/or standing)   OT Treatment/Interventions: Self-care/ADL training;Patient/family education;DME and/or AE instruction;Energy conservation    OT Goals(Current goals can be found in the care plan section) Acute Rehab OT Goals Patient Stated Goal: to be able to breathe and feel better OT Goal Formulation: With patient Time For Goal Achievement: 11/01/15 Potential to Achieve Goals: Good ADL Goals Pt Will Perform Grooming: with modified independence;standing Pt Will Transfer to Toilet: with modified independence;ambulating;bedside commode (over toilet) Pt Will Perform Toileting - Clothing Manipulation and hygiene: with modified independence;sit to/from stand Additional ADL Goal #1: Pt will employ energy conservation strategies in ADL and mobility independently.  OT Frequency: Min 2X/week   Barriers to D/C:            Co-evaluation              End of Session Equipment Utilized During Treatment: Gait belt;Oxygen  Activity Tolerance: Patient limited by fatigue Patient left: in chair;with chair alarm set;with call bell/phone within reach   Time: 1610-9604 OT Time Calculation (min): 24 min Charges:  OT General Charges $OT Visit: 1 Procedure OT Evaluation $OT Eval Moderate Complexity: 1 Procedure OT Treatments $Self Care/Home Management : 8-22 mins G-Codes:    Evern Bio 10/25/2015, 11:22 AM  [phone]

## 2015-10-25 NOTE — Progress Notes (Signed)
Followed up with patient regarding insulin. Patient reports that he is followed by Dr. Laurann Montana (PCP) for diabetes management. Patient states that he has a glucometer at home but he only has 3 test strips left. Therefore, patient will need a prescription for test strips at time of discharge. Patient was unclear about normal glucose values. Reviewed signs and symptoms of hyperglycemia and hypoglycemia along with treatment for both. Discussed glucose and A1C goals. Discussed importance of checking CBGs and maintaining good CBG control to prevent long-term and short-term complications. Stressed to the patient the importance of improving glycemic control to prevent further complications from uncontrolled diabetes. Encouraged patient to check his glucose 4 times per day (before meals and at bedtime) and to keep a log book of glucose readings and insulin taken which he will need to take to doctor appointments. Explained how the doctor he follows up with can use the log book to continue to make insulin adjustments if needed. Discussed Lantus insulin and how it should be taken. Patient states that his late wife was on insulin and she usually gave herself her own insulin injections with vial/syringe. Discussed vial/syringe versus insulin pen and patient states that he would prefer the insulin pen. Educated patient on insulin pen use at home. Reviewed contents of insulin flexpen starter kit. Reviewed all steps of insulin pen including attachment of needle, 2-unit air shot, dialing up dose, giving injection, removing needle, disposal of sharps, storage of unused insulin, disposal of insulin etc. Patient able to provide successful return demonstration. Patient stated that his son will be staying with him initially when he is discharged and will be helping him with insulin if needed. Patient also reports that he is suppose to have a home health nurse come out to his home after discharge. RNs to provide ongoing basic DM education  at bedside with this patient and engage patient to actively check blood glucose and administer insulin injections.  Informed patient that he will be asked to self administer insulin injections as an inpatient to ensure he understands how to self inject insulin. Patient verbalized understanding of information discussed and he states that he has no further questions at this time related to diabetes.  Thanks, Barnie Alderman, RN, MSN, CDE Diabetes Coordinator Inpatient Diabetes Program [phone] (Team Pager) [phone] (AP office) [phone] [facility] office) [phone] [facility] office)

## 2015-10-25 NOTE — Progress Notes (Signed)
Patient being transferred to 5W. Report called to [facility]. All questions answered. Patient informed family of transfer.

## 2015-10-26 LAB — GLUCOSE, CAPILLARY
Glucose-Capillary: 127 mg/dL — ABNORMAL HIGH (ref 65–99)
Glucose-Capillary: 309 mg/dL — ABNORMAL HIGH (ref 65–99)

## 2015-10-26 MED ORDER — PREDNISONE 10 MG PO TABS: ORAL_TABLET | ORAL | Status: DC

## 2015-10-26 MED ORDER — GLUCOSE BLOOD VI STRP: ORAL_STRIP | Status: AC

## 2015-10-26 MED ORDER — METFORMIN HCL 500 MG PO TABS: ORAL_TABLET | ORAL | Status: DC

## 2015-10-26 MED ORDER — GLIMEPIRIDE 4 MG PO TABS: 4.0000 mg | ORAL_TABLET | Freq: Every day | ORAL | Status: DC

## 2015-10-26 NOTE — Progress Notes (Signed)
PT Cancellation Note  Patient Details Name: Alejandro Hall MRN: [id number] DOB: 08/29/1928   Cancelled Treatment:    Reason Eval/Treat Not Completed: Other (comment); patient and RN report pt to d/c soon.  Discussed home entry with 7 steps and reports son picking him up and will help him up with railing.  Declined practice due to saving strength for going home.   Elray McgregorCynthia Wynn 10/26/2015, 3:31 PM  Sheran Lawlessyndi Wynn, [place]PT [phone] 10/26/2015

## 2015-10-26 NOTE — Discharge Summary (Signed)
Physician Discharge Summary  Alejandro Hall ZOX:[id number] DOB: [birth year] DOA: 10/22/2015  PCP: Lillia Mountain, MD  Admit date: 10/22/2015 Discharge date: 10/26/2015  Time spent: greater than 30 minutes  Recommendations for Outpatient Follow-up:  1. Home PT, RN   Discharge Diagnoses:  Principal Problem:   Acute respiratory failure with hypoxia (HCC) Active Problems:   HTN (hypertension)   COPD exacerbation (HCC)   Diabetes mellitus type 2, uncontrolled (HCC)   DM type 2, goal HbA1c < 7% (HCC)   Discharge Condition: stable  Diet recommendation: heart healthy, carbohydrate modified  Filed Weights   10/24/15 0500 10/25/15 0500 10/26/15 0622  Weight: 87.6 kg (193 lb 2 oz) 87.5 kg (192 lb 14.4 oz) 87.5 kg (192 lb 14.4 oz)    History of present illness:  82 y.o. male history of COPD on home oxygen, diabetes mellitus type 2, hypertension and hyperlipidemia presents to the ER because of sudden onset of shortness of breath since last night. Patient states his oxygenation has not been working for last few days. Last evening he suddenly became short of breath and since he has not had any oxygen at home is shortness of breath became worse. Has benign productive cough which is chronic. Patient also has chest pain on the left side which patient states has been chronic since last 3 years after his shingles. No new rash. In the ER patient initially was found to be wheezing and was given nebulizer. Chest x-ray does not show anything acute. On exam patient is minimally short of breath still. Patient's blood sugar is elevated and patient states his sugar runs around 200-300 home  Hospital Course:  #1 acute on chronic respiratory failure with hypoxia Patient on 2.5 L nasal calvarial home O2. Patient initially required 6 L nasal cannula. Secondary to an acute COPD exacerbation/?? Diastolic CHF. Patient received Lasix 20 mg IV on admission and is -4.1 L during this hospitalization. 2-D echo with normal  EF and no wall motion abnormalities. Current weight is 87.6 kg from 87.8 kg 10/23/2015 from 89.3 kg 10/22/2015. Continue steroid taper, Levaquin, Pulmicort, scheduled nebulizers. At discharge breathing close to baseline. Will go home with home health  #2 acute COPD exacerbation Improved with steroids, antibiotics, bronchodilators  #3 poorly controlled type 2 diabetes mellitus On admission patient had elevated CBGs and had to be placed on insulin drip. Hemoglobin A1c is 10.9. Transitioned to [facility] insulin. Was on amaryl 2 mg PTA. Patient refuses home insulin at this time, preferring to take oral hypoglycemics. Will increase amaryl to 4 mg, add metformin. Understands will likely need insulin soon. Will f/u with PCP  #4 acute on chronic kidney disease stage II Baseline creatinine around 1 in 2015. Creatinine peaked at 1.5 Renal function trending down and currently at 1.09. Follow.  #5 hypertension Stable. Continue Norvasc.   Procedures:  none  Consultations:  none  Discharge Exam: Filed Vitals:   10/26/15 1052 10/26/15 1328  BP: 108/73 116/73  Pulse:  83  Temp:  97.7 F (36.5 C)  Resp:  17    General: comfortable a and o Cardiovascular: RRR Respiratory: CTA Ext no CCE  Discharge Instructions   Discharge Instructions    Diet - low sodium heart healthy    Complete by:  As directed      Diet Carb Modified    Complete by:  As directed      Increase activity slowly    Complete by:  As directed  Current Discharge Medication List    START taking these medications   Details  glucose blood test strip Use as instructed Qty: 100 each, Refills: 12    metFORMIN (GLUCOPHAGE) 500 MG tablet 1 tab po daily for 3 days, then 1 po bid for 3 days, then 2 po bid Qty: 110 tablet, Refills: 0    predniSONE (DELTASONE) 10 MG tablet Take 3 tablets PO on Saturday, 2 tablets PO on Sunday, 1 tab PO on Monday, then stop Qty: 6 tablet, Refills: 0      CONTINUE these medications  which have CHANGED   Details  glimepiride (AMARYL) 4 MG tablet Take 1 tablet (4 mg total) by mouth daily with breakfast. Qty: 30 tablet, Refills: 0      CONTINUE these medications which have NOT CHANGED   Details  albuterol (PROVENTIL HFA;VENTOLIN HFA) 108 (90 BASE) MCG/ACT inhaler Inhale 2 puffs into the lungs every 6 (six) hours as needed for wheezing or shortness of breath. Qty: 1 Inhaler, Refills: 0    amLODipine (NORVASC) 10 MG tablet Take 1 tablet (10 mg total) by mouth daily. Qty: 30 tablet, Refills: 0    atorvastatin (LIPITOR) 10 MG tablet Take 10 mg by mouth daily. Refills: 3    aspirin EC 81 MG EC tablet Take 1 tablet (81 mg total) by mouth daily. Qty: 30 tablet, Refills: 0      STOP taking these medications     benzonatate (TESSALON) 200 MG capsule      insulin glargine (LANTUS) 100 UNIT/ML injection      predniSONE (STERAPRED UNI-PAK) 10 MG tablet        No Known Allergies Follow-up Information    Follow up with Lillia Mountain, MD In 2 weeks.   Specialty:  Internal Medicine   Contact information:   [address] [place] [address] [phone]        The results of significant diagnostics from this hospitalization (including imaging, microbiology, ancillary and laboratory) are listed below for reference.    Significant Diagnostic Studies: Dg Chest 2 View  10/22/2015  CLINICAL DATA:  82 year old male with shortness of breath EXAM: CHEST  2 VIEW COMPARISON:  Radiograph dated 08/12/2014 FINDINGS: Two views of the chest demonstrate emphysematous changes of the lungs. There is no focal consolidation, pleural effusion, or pneumothorax. Stable cardiac silhouette. No acute osseous pathology. IMPRESSION: No active cardiopulmonary disease. Electronically Signed   By: Elgie Collard M.D.   On: 10/22/2015 02:33    Microbiology: Recent Results (from the past 240 hour(s))  Respiratory virus panel     Status: None   Collection Time: 10/22/15   9:40 AM  Result Value Ref Range Status   Respiratory Syncytial Virus A Negative Negative Final   Respiratory Syncytial Virus B Negative Negative Final   Influenza A Negative Negative Final   Influenza B Negative Negative Final   Parainfluenza 1 Negative Negative Final   Parainfluenza 2 Negative Negative Final   Parainfluenza 3 Negative Negative Final   Metapneumovirus Negative Negative Final   Rhinovirus Negative Negative Final   Adenovirus Negative Negative Final    Comment: (NOTE) Performed At: [facility] [address] [place], [place] [id number] Mila Homer MD OZ:[id number]   MRSA PCR Screening     Status: None   Collection Time: 10/22/15  5:20 PM  Result Value Ref Range Status   MRSA by PCR NEGATIVE NEGATIVE Final    Comment:        The GeneXpert  MRSA Assay (FDA approved for NASAL specimens only), is one component of a comprehensive MRSA colonization surveillance program. It is not intended to diagnose MRSA infection nor to guide or monitor treatment for MRSA infections.      Labs: Basic Metabolic Panel:  Recent Labs Lab 10/22/15 0030 10/22/15 0621 10/23/15 0237 10/24/15 0501 10/25/15 0400  NA 139 141 141 139 139  K 3.8 3.4* 4.3 4.5 3.7  CL 101 104 107 103 104  CO2 24 19* 24 26 25   GLUCOSE 468* 523* 259* 316* 203*  BUN 20 22* 25* 27* 26*  CREATININE 1.17 1.56* 1.22 1.09 1.04  CALCIUM 9.4 9.1 8.6* 8.3* 8.4*  MG  --   --  2.0  --  2.3   Liver Function Tests:  Recent Labs Lab 10/22/15 0621  AST 31  ALT 19  ALKPHOS 51  BILITOT 0.5  PROT 7.2  ALBUMIN 4.1   No results for input(s): LIPASE, AMYLASE in the last 168 hours. No results for input(s): AMMONIA in the last 168 hours. CBC:  Recent Labs Lab 10/22/15 0030 10/22/15 0621 10/23/15 0237 10/24/15 0501 10/25/15 0400  WBC 14.5* 11.8* 20.0* 15.8* 15.7*  NEUTROABS  --  10.9*  --   --   --   HGB 14.2 12.8* 12.0* 12.5* 13.8  HCT 43.7 40.1 37.6* 39.8 43.3  MCV 85.5 86.1 84.9  86.3 85.4  PLT 267 266 235 259 245   Cardiac Enzymes:  Recent Labs Lab 10/22/15 0621 10/22/15 1626 10/22/15 2157  TROPONINI <0.03 <0.03 0.03   BNP: BNP (last 3 results)  Recent Labs  10/22/15 0635  BNP 30.1    ProBNP (last 3 results) No results for input(s): PROBNP in the last 8760 hours.  CBG:  Recent Labs Lab 10/25/15 1233 10/25/15 1710 10/25/15 2143 10/26/15 0732 10/26/15 1214  GLUCAP 229* 319* 284* 127* 309*       Signed:  Christiane HaSULLIVAN,Atom Solivan L MD [facility] 10/26/2015, 2:10 PM

## 2015-10-26 NOTE — Progress Notes (Signed)
Pt given discharge instructions, prescriptions, and care notes. Pt verbalized understanding AEB no further questions or concerns at this time. IV was discontinued, no redness, pain, or swelling noted at this time. Pt left the floor via wheelchair with staff in stable condition. 

## 2016-01-26 ENCOUNTER — Encounter (HOSPITAL_COMMUNITY): Payer: Self-pay | Admitting: *Deleted

## 2016-01-26 ENCOUNTER — Emergency Department (HOSPITAL_COMMUNITY)
Admission: EM | Admit: 2016-01-26 | Discharge: 2016-01-27 | Disposition: A | Discharge status: Home / Self Care | Payer: Medicare Other | Attending: Emergency Medicine | Admitting: Emergency Medicine

## 2016-01-26 ENCOUNTER — Emergency Department (HOSPITAL_COMMUNITY): Payer: Medicare Other

## 2016-01-26 DIAGNOSIS — Z79899 Other long term (current) drug therapy: Secondary | ICD-10-CM | POA: Insufficient documentation

## 2016-01-26 DIAGNOSIS — Z7982 Long term (current) use of aspirin: Secondary | ICD-10-CM | POA: Diagnosis not present

## 2016-01-26 DIAGNOSIS — E119 Type 2 diabetes mellitus without complications: Secondary | ICD-10-CM | POA: Insufficient documentation

## 2016-01-26 DIAGNOSIS — J441 Chronic obstructive pulmonary disease with (acute) exacerbation: Secondary | ICD-10-CM | POA: Diagnosis not present

## 2016-01-26 DIAGNOSIS — R000 Tachycardia, unspecified: Secondary | ICD-10-CM | POA: Diagnosis not present

## 2016-01-26 DIAGNOSIS — Z87442 Personal history of urinary calculi: Secondary | ICD-10-CM | POA: Diagnosis not present

## 2016-01-26 DIAGNOSIS — I10 Essential (primary) hypertension: Secondary | ICD-10-CM | POA: Insufficient documentation

## 2016-01-26 DIAGNOSIS — Z7984 Long term (current) use of oral hypoglycemic drugs: Secondary | ICD-10-CM | POA: Insufficient documentation

## 2016-01-26 DIAGNOSIS — M1990 Unspecified osteoarthritis, unspecified site: Secondary | ICD-10-CM | POA: Insufficient documentation

## 2016-01-26 DIAGNOSIS — R0602 Shortness of breath: Secondary | ICD-10-CM | POA: Diagnosis present

## 2016-01-26 LAB — CBC
HCT: 40.3 % (ref 39.0–52.0)
HEMOGLOBIN: 12.7 g/dL — AB (ref 13.0–17.0)
MCH: 27.1 pg (ref 26.0–34.0)
MCHC: 31.5 g/dL (ref 30.0–36.0)
MCV: 86.1 fL (ref 78.0–100.0)
PLATELETS: 284 10*3/uL (ref 150–400)
RBC: 4.68 MIL/uL (ref 4.22–5.81)
RDW: 15.2 % (ref 11.5–15.5)
WBC: 14.2 10*3/uL — AB (ref 4.0–10.5)

## 2016-01-26 LAB — BASIC METABOLIC PANEL
ANION GAP: 7 (ref 5–15)
BUN: 17 mg/dL (ref 6–20)
CALCIUM: 9.1 mg/dL (ref 8.9–10.3)
CHLORIDE: 105 mmol/L (ref 101–111)
CO2: 26 mmol/L (ref 22–32)
CREATININE: 1.13 mg/dL (ref 0.61–1.24)
GFR calc non Af Amer: 57 mL/min — ABNORMAL LOW (ref >60)
Glucose, Bld: 118 mg/dL — ABNORMAL HIGH (ref 65–99)
Potassium: 4.3 mmol/L (ref 3.5–5.1)
SODIUM: 138 mmol/L (ref 135–145)

## 2016-01-26 LAB — I-STAT TROPONIN, ED: TROPONIN I, POC: 0.01 ng/mL (ref 0.00–0.08)

## 2016-01-26 LAB — CBG MONITORING, ED: GLUCOSE-CAPILLARY: 130 mg/dL — AB (ref 65–99)

## 2016-01-26 LAB — I-STAT VENOUS BLOOD GAS, ED
Acid-Base Excess: 4 mmol/L — ABNORMAL HIGH (ref 0.0–2.0)
Bicarbonate: 29.3 mEq/L — ABNORMAL HIGH (ref 20.0–24.0)
O2 SAT: 61 %
PCO2 VEN: 45.3 mmHg (ref 45.0–50.0)
TCO2: 31 mmol/L (ref 0–100)
pH, Ven: 7.418 — ABNORMAL HIGH (ref 7.250–7.300)
pO2, Ven: 31 mmHg (ref 31.0–45.0)

## 2016-01-26 LAB — BRAIN NATRIURETIC PEPTIDE: B NATRIURETIC PEPTIDE 5: 19.4 pg/mL (ref 0.0–100.0)

## 2016-01-26 LAB — TROPONIN I: Troponin I: <0.03 ng/mL (ref <0.031)

## 2016-01-26 MED ORDER — DEXTROSE 5 % IV SOLN
500.0000 mg | Freq: Once | INTRAVENOUS | Status: AC
  Administered 2016-01-26: 500 mg via INTRAVENOUS
  Filled 2016-01-26: qty 500

## 2016-01-26 MED ORDER — METHYLPREDNISOLONE SODIUM SUCC 125 MG IJ SOLR
125.0000 mg | Freq: Once | INTRAMUSCULAR | Status: AC
  Administered 2016-01-26: 125 mg via INTRAVENOUS
  Filled 2016-01-26: qty 2

## 2016-01-26 MED ORDER — MAGNESIUM SULFATE 2 GM/50ML IV SOLN
2.0000 g | Freq: Once | INTRAVENOUS | Status: AC
  Administered 2016-01-26: 2 g via INTRAVENOUS
  Filled 2016-01-26: qty 50

## 2016-01-26 MED ORDER — IPRATROPIUM BROMIDE 0.02 % IN SOLN
1.0000 mg | Freq: Once | RESPIRATORY_TRACT | Status: AC
  Administered 2016-01-26: 1 mg via RESPIRATORY_TRACT
  Filled 2016-01-26: qty 5

## 2016-01-26 MED ORDER — ALBUTEROL (5 MG/ML) CONTINUOUS INHALATION SOLN
10.0000 mg/h | INHALATION_SOLUTION | RESPIRATORY_TRACT | Status: DC
  Administered 2016-01-26: 10 mg/h via RESPIRATORY_TRACT
  Filled 2016-01-26: qty 20

## 2016-01-26 MED ORDER — IPRATROPIUM-ALBUTEROL 0.5-2.5 (3) MG/3ML IN SOLN
3.0000 mL | RESPIRATORY_TRACT | Status: AC
  Administered 2016-01-26: 3 mL via RESPIRATORY_TRACT
  Filled 2016-01-26: qty 3

## 2016-01-26 MED ORDER — IPRATROPIUM-ALBUTEROL 0.5-2.5 (3) MG/3ML IN SOLN: 3.0000 mL | RESPIRATORY_TRACT | Status: AC

## 2016-01-26 NOTE — ED Notes (Signed)
Pt in c/o shortness of breath, chest pain, and hyperglycemia since this am, pt speaking in short phrases in triage, alert and oriented. On home O2 at 3L, on 5L at this time.

## 2016-01-26 NOTE — ED Provider Notes (Signed)
CSN: [id number]     Arrival date & time 01/26/16  1955 History   First MD Initiated Contact with Patient 01/26/16 2004     Chief Complaint  Patient presents with  . Shortness of Breath  . Chest Pain  . Hyperglycemia     (Consider location/radiation/quality/duration/timing/severity/associated sxs/prior Treatment) Patient is a 82 y.o. male presenting with shortness of breath.  Shortness of Breath Severity:  Moderate Onset quality:  Gradual Duration:  12 hours Timing:  Constant Progression:  Worsening Chronicity:  Recurrent Context: activity   Relieved by:  None tried Worsened by:  Nothing tried Ineffective treatments:  None tried Associated symptoms: cough   Associated symptoms: no abdominal pain, no fever, no hemoptysis and no neck pain     Past Medical History  Diagnosis Date  . COPD (chronic obstructive pulmonary disease) (HCC)   . Seizures (HCC)   . Hypertension   . Diabetes mellitus without complication (HCC)   . Shortness of breath dyspnea   . History of kidney stones   . Arthritis    Past Surgical History  Procedure Laterality Date  . Appendectomy    . Tonsillectomy    . Hernia repair    . Melanoma excision      facial moles   Family History  Problem Relation Age of Onset  . Diabetes Mellitus II Neg Hx   . Hypertension Neg Hx    Social History  Substance Use Topics  . Smoking status: Former Games developermoker  . Smokeless tobacco: Never Used     Comment: quit smoking in 1982  . Alcohol Use: No    Review of Systems  Constitutional: Negative for fever.  Eyes: Negative for pain.  Respiratory: Positive for cough and shortness of breath. Negative for hemoptysis.   Cardiovascular: Negative for leg swelling.  Gastrointestinal: Negative for abdominal pain.  Endocrine: Negative for polydipsia and polyuria.  Genitourinary: Negative for dysuria.  Musculoskeletal: Negative for neck pain.  All other systems reviewed and are negative.     Allergies  Review of  patient's allergies indicates no known allergies.  Home Medications   Prior to Admission medications   Medication Sig Start Date End Date Taking? Authorizing Provider  albuterol (PROVENTIL HFA;VENTOLIN HFA) 108 (90 BASE) MCG/ACT inhaler Inhale 2 puffs into the lungs every 6 (six) hours as needed for wheezing or shortness of breath. 08/17/14   Penny Piarlando Vega, MD  amLODipine (NORVASC) 10 MG tablet Take 1 tablet (10 mg total) by mouth daily. 08/18/14   Penny Piarlando Vega, MD  aspirin EC 81 MG EC tablet Take 1 tablet (81 mg total) by mouth daily. Patient not taking: Reported on 10/22/2015 08/17/14   Penny Piarlando Vega, MD  atorvastatin (LIPITOR) 10 MG tablet Take 10 mg by mouth daily. 08/09/15   Historical Provider, MD  glimepiride (AMARYL) 4 MG tablet Take 1 tablet (4 mg total) by mouth daily with breakfast. 10/26/15   Christiane Haorinna L Sullivan, MD  glucose blood test strip Use as instructed 10/26/15   Christiane Haorinna L Sullivan, MD  metFORMIN (GLUCOPHAGE) 500 MG tablet 1 tab po daily for 3 days, then 1 po bid for 3 days, then 2 po bid 10/26/15   Christiane Haorinna L Sullivan, MD  predniSONE (DELTASONE) 10 MG tablet Take 3 tablets PO on Saturday, 2 tablets PO on Sunday, 1 tab PO on Monday, then stop 10/26/15   Christiane Haorinna L Sullivan, MD   BP 154/83 mmHg  Pulse 109  Temp(Src) 98 F (36.7 C) (Oral)  Resp 32  SpO2 96% Physical  Exam  Constitutional: He is oriented to person, place, and time. He appears well-developed and well-nourished.  HENT:  Head: Normocephalic and atraumatic.  Eyes: Pupils are equal, round, and reactive to light.  Neck: Normal range of motion.  Cardiovascular: Tachycardia present.   Pulmonary/Chest: Effort normal. Tachypnea noted. No respiratory distress. He has decreased breath sounds. He has wheezes.  Abdominal: Soft. He exhibits no distension. There is no tenderness.  Musculoskeletal: Normal range of motion. He exhibits no edema or tenderness.  Neurological: He is alert and oriented to person, place, and time.  Skin:  Skin is warm and dry.  Nursing note and vitals reviewed.   ED Course  Procedures (including critical care time) Labs Review Labs Reviewed  BASIC METABOLIC PANEL  CBC  I-STAT TROPOININ, ED    Imaging Review No results found. I have personally reviewed and evaluated these images and lab results as part of my medical decision-making.   EKG Interpretation   Date/Time:  Saturday January 26 2016 20:03:39 EDT Ventricular Rate:  109 PR Interval:  140 QRS Duration: 76 QT Interval:  298 QTC Calculation: 401 R Axis:   45 Text Interpretation:  Sinus tachycardia Nonspecific ST abnormality  Abnormal ECG similar to february Confirmed by [facility] MD, Barbara Cower [phone]) on  01/26/2016 8:03:37 PM      MDM   Final diagnoses:  COPD exacerbation (HCC)   82 year old male here with COPD exacerbation. Mild respiratory distress on initial examination with tachypnea decreased breath sounds and wheezing. We'll start DuoNeb's, steroids and antibiotics. We'll reevaluate for ability to go home.  Reevaluation patient feels slightly better however still has not received a DuoNeb's as they were canceled inappropriately. I will reorder them and reevaluate in 1 hour.  Reevaluation and patient is still slightly tachypneic but is oxygenating well and per patient and his grand son breathing at his normal rate and is comfotable. Close to baseline respiratory-wise.  Discussed disposition with patient and grandson and they prefer to go home.  Will dc on q4h albuterol and steroids, abx. Will fu with PCP on Monday, if worsens again, will return here for repeat workup.   New Prescriptions: Discharge Medication List as of 01/27/2016 12:17 AM    START taking these medications   Details  azithromycin (ZITHROMAX) 250 MG tablet Take 1 tablet (250 mg total) by mouth daily. Take 1 every day until finished., Starting 01/27/2016, Until Discontinued, Print    predniSONE (DELTASONE) 20 MG tablet 2 tabs po daily x 4 days, Print          I have personally and contemperaneously reviewed labs and imaging and used in my decision making as above.   A medical screening exam was performed and I feel the patient has had an appropriate workup for their chief complaint at this time and likelihood of emergent condition existing is low and thus workup can continue on an outpatient basis.. Their vital signs are stable. They have been counseled on decision, discharge, follow up and which symptoms necessitate immediate return to the emergency department.  They verbally stated understanding and agreement with plan and discharged in stable condition.      Marily Memos, MD 01/27/16 1228

## 2016-01-26 NOTE — ED Notes (Signed)
CHECKED CBG 130, RN MO INFORMED

## 2016-01-27 LAB — I-STAT TROPONIN, ED: Troponin i, poc: 0 ng/mL (ref 0.00–0.08)

## 2016-01-27 MED ORDER — AZITHROMYCIN 250 MG PO TABS: 250.0000 mg | ORAL_TABLET | Freq: Every day | ORAL | Status: DC

## 2016-01-27 MED ORDER — PREDNISONE 20 MG PO TABS: ORAL_TABLET | ORAL | Status: DC

## 2016-01-27 MED ORDER — ALBUTEROL SULFATE HFA 108 (90 BASE) MCG/ACT IN AERS
2.0000 | INHALATION_SPRAY | Freq: Once | RESPIRATORY_TRACT | Status: AC
  Administered 2016-01-27: 2 via RESPIRATORY_TRACT
  Filled 2016-01-27: qty 6.7

## 2020-08-10 ENCOUNTER — Inpatient Hospital Stay (HOSPITAL_COMMUNITY)
Admission: EM | Admit: 2020-08-10 | Discharge: 2020-08-25 | DRG: 871 | Disposition: E | Payer: Medicare Other | Attending: Internal Medicine | Admitting: Internal Medicine

## 2020-08-10 ENCOUNTER — Other Ambulatory Visit: Payer: Self-pay

## 2020-08-10 ENCOUNTER — Emergency Department (HOSPITAL_COMMUNITY): Payer: Medicare Other

## 2020-08-10 DIAGNOSIS — Z7952 Long term (current) use of systemic steroids: Secondary | ICD-10-CM

## 2020-08-10 DIAGNOSIS — A419 Sepsis, unspecified organism: Secondary | ICD-10-CM | POA: Diagnosis present

## 2020-08-10 DIAGNOSIS — Z7951 Long term (current) use of inhaled steroids: Secondary | ICD-10-CM

## 2020-08-10 DIAGNOSIS — L8990 Pressure ulcer of unspecified site, unspecified stage: Secondary | ICD-10-CM | POA: Insufficient documentation

## 2020-08-10 DIAGNOSIS — R627 Adult failure to thrive: Secondary | ICD-10-CM

## 2020-08-10 DIAGNOSIS — E43 Unspecified severe protein-calorie malnutrition: Secondary | ICD-10-CM

## 2020-08-10 DIAGNOSIS — Z85820 Personal history of malignant melanoma of skin: Secondary | ICD-10-CM

## 2020-08-10 DIAGNOSIS — R1310 Dysphagia, unspecified: Secondary | ICD-10-CM | POA: Diagnosis present

## 2020-08-10 DIAGNOSIS — J069 Acute upper respiratory infection, unspecified: Secondary | ICD-10-CM

## 2020-08-10 DIAGNOSIS — A4189 Other specified sepsis: Secondary | ICD-10-CM | POA: Diagnosis not present

## 2020-08-10 DIAGNOSIS — U071 COVID-19: Secondary | ICD-10-CM

## 2020-08-10 DIAGNOSIS — I272 Pulmonary hypertension, unspecified: Secondary | ICD-10-CM

## 2020-08-10 DIAGNOSIS — L89222 Pressure ulcer of left hip, stage 2: Secondary | ICD-10-CM | POA: Diagnosis present

## 2020-08-10 DIAGNOSIS — L89212 Pressure ulcer of right hip, stage 2: Secondary | ICD-10-CM | POA: Diagnosis present

## 2020-08-10 DIAGNOSIS — G9349 Other encephalopathy: Secondary | ICD-10-CM

## 2020-08-10 DIAGNOSIS — Z7982 Long term (current) use of aspirin: Secondary | ICD-10-CM

## 2020-08-10 DIAGNOSIS — Z87891 Personal history of nicotine dependence: Secondary | ICD-10-CM

## 2020-08-10 DIAGNOSIS — E11649 Type 2 diabetes mellitus with hypoglycemia without coma: Secondary | ICD-10-CM | POA: Diagnosis not present

## 2020-08-10 DIAGNOSIS — G9341 Metabolic encephalopathy: Secondary | ICD-10-CM

## 2020-08-10 DIAGNOSIS — I10 Essential (primary) hypertension: Secondary | ICD-10-CM | POA: Diagnosis present

## 2020-08-10 DIAGNOSIS — J1282 Pneumonia due to coronavirus disease 2019: Secondary | ICD-10-CM | POA: Diagnosis present

## 2020-08-10 DIAGNOSIS — Z66 Do not resuscitate: Secondary | ICD-10-CM | POA: Diagnosis present

## 2020-08-10 DIAGNOSIS — G40909 Epilepsy, unspecified, not intractable, without status epilepticus: Secondary | ICD-10-CM | POA: Diagnosis present

## 2020-08-10 DIAGNOSIS — Z681 Body mass index (BMI) 19 or less, adult: Secondary | ICD-10-CM

## 2020-08-10 DIAGNOSIS — Z9981 Dependence on supplemental oxygen: Secondary | ICD-10-CM

## 2020-08-10 DIAGNOSIS — J159 Unspecified bacterial pneumonia: Secondary | ICD-10-CM | POA: Diagnosis present

## 2020-08-10 DIAGNOSIS — E162 Hypoglycemia, unspecified: Secondary | ICD-10-CM

## 2020-08-10 DIAGNOSIS — Z79899 Other long term (current) drug therapy: Secondary | ICD-10-CM

## 2020-08-10 DIAGNOSIS — J189 Pneumonia, unspecified organism: Secondary | ICD-10-CM

## 2020-08-10 DIAGNOSIS — E119 Type 2 diabetes mellitus without complications: Secondary | ICD-10-CM

## 2020-08-10 DIAGNOSIS — Z7984 Long term (current) use of oral hypoglycemic drugs: Secondary | ICD-10-CM

## 2020-08-10 DIAGNOSIS — L89112 Pressure ulcer of right upper back, stage 2: Secondary | ICD-10-CM | POA: Diagnosis present

## 2020-08-10 DIAGNOSIS — D6489 Other specified anemias: Secondary | ICD-10-CM | POA: Diagnosis present

## 2020-08-10 DIAGNOSIS — Z515 Encounter for palliative care: Secondary | ICD-10-CM

## 2020-08-10 DIAGNOSIS — I4891 Unspecified atrial fibrillation: Secondary | ICD-10-CM

## 2020-08-10 DIAGNOSIS — R400 Somnolence: Secondary | ICD-10-CM

## 2020-08-10 DIAGNOSIS — E876 Hypokalemia: Secondary | ICD-10-CM

## 2020-08-10 DIAGNOSIS — J440 Chronic obstructive pulmonary disease with acute lower respiratory infection: Secondary | ICD-10-CM | POA: Diagnosis present

## 2020-08-10 DIAGNOSIS — J9621 Acute and chronic respiratory failure with hypoxia: Secondary | ICD-10-CM | POA: Diagnosis present

## 2020-08-10 DIAGNOSIS — R64 Cachexia: Secondary | ICD-10-CM | POA: Diagnosis present

## 2020-08-10 DIAGNOSIS — R6520 Severe sepsis without septic shock: Secondary | ICD-10-CM | POA: Diagnosis present

## 2020-08-10 DIAGNOSIS — I6310 Cerebral infarction due to embolism of unspecified precerebral artery: Secondary | ICD-10-CM

## 2020-08-10 DIAGNOSIS — J9601 Acute respiratory failure with hypoxia: Secondary | ICD-10-CM | POA: Diagnosis present

## 2020-08-10 DIAGNOSIS — L89122 Pressure ulcer of left upper back, stage 2: Secondary | ICD-10-CM | POA: Diagnosis present

## 2020-08-10 DIAGNOSIS — F419 Anxiety disorder, unspecified: Secondary | ICD-10-CM | POA: Diagnosis present

## 2020-08-10 LAB — CBC WITH DIFFERENTIAL/PLATELET
Abs Immature Granulocytes: 0.06 10*3/uL (ref 0.00–0.07)
Basophils Absolute: 0 10*3/uL (ref 0.0–0.1)
Basophils Relative: 0 %
Eosinophils Absolute: 0 10*3/uL (ref 0.0–0.5)
Eosinophils Relative: 0 %
HCT: 40 % (ref 39.0–52.0)
Hemoglobin: 12.3 g/dL — ABNORMAL LOW (ref 13.0–17.0)
Immature Granulocytes: 1 %
Lymphocytes Relative: 7 %
Lymphs Abs: 0.7 10*3/uL (ref 0.7–4.0)
MCH: 27 pg (ref 26.0–34.0)
MCHC: 30.8 g/dL (ref 30.0–36.0)
MCV: 87.9 fL (ref 80.0–100.0)
Monocytes Absolute: 0.5 10*3/uL (ref 0.1–1.0)
Monocytes Relative: 5 %
Neutro Abs: 9.2 10*3/uL — ABNORMAL HIGH (ref 1.7–7.7)
Neutrophils Relative %: 87 %
Platelets: 343 10*3/uL (ref 150–400)
RBC: 4.55 MIL/uL (ref 4.22–5.81)
RDW: 14.3 % (ref 11.5–15.5)
WBC: 10.5 10*3/uL (ref 4.0–10.5)
nRBC: 0 % (ref 0.0–0.2)

## 2020-08-10 LAB — COMPREHENSIVE METABOLIC PANEL WITH GFR
ALT: 27 U/L (ref 0–44)
AST: 56 U/L — ABNORMAL HIGH (ref 15–41)
Albumin: 2.9 g/dL — ABNORMAL LOW (ref 3.5–5.0)
Alkaline Phosphatase: 76 U/L (ref 38–126)
Anion gap: 22 — ABNORMAL HIGH (ref 5–15)
BUN: 27 mg/dL — ABNORMAL HIGH (ref 8–23)
CO2: 30 mmol/L (ref 22–32)
Calcium: 8.6 mg/dL — ABNORMAL LOW (ref 8.9–10.3)
Chloride: 88 mmol/L — ABNORMAL LOW (ref 98–111)
Creatinine, Ser: 1.26 mg/dL — ABNORMAL HIGH (ref 0.61–1.24)
GFR, Estimated: 55 mL/min — ABNORMAL LOW
Glucose, Bld: 98 mg/dL (ref 70–99)
Potassium: 3.5 mmol/L (ref 3.5–5.1)
Sodium: 140 mmol/L (ref 135–145)
Total Bilirubin: 0.8 mg/dL (ref 0.3–1.2)
Total Protein: 6.9 g/dL (ref 6.5–8.1)

## 2020-08-10 LAB — PROTIME-INR
INR: 1 (ref 0.8–1.2)
Prothrombin Time: 12.5 seconds (ref 11.4–15.2)

## 2020-08-10 LAB — LACTIC ACID, PLASMA: Lactic Acid, Venous: 3.1 mmol/L (ref 0.5–1.9)

## 2020-08-10 NOTE — ED Triage Notes (Signed)
Pt presents to ED POV AMS x1d. Per son pt altered at baseline but increased from baseline. Pt incontinent to urine upon arrival. Disoriented x4, follows commands.

## 2020-08-11 ENCOUNTER — Emergency Department (HOSPITAL_COMMUNITY): Payer: Medicare Other

## 2020-08-11 ENCOUNTER — Inpatient Hospital Stay (HOSPITAL_COMMUNITY): Payer: Medicare Other

## 2020-08-11 DIAGNOSIS — Z681 Body mass index (BMI) 19 or less, adult: Secondary | ICD-10-CM | POA: Diagnosis not present

## 2020-08-11 DIAGNOSIS — R0600 Dyspnea, unspecified: Secondary | ICD-10-CM

## 2020-08-11 DIAGNOSIS — E119 Type 2 diabetes mellitus without complications: Secondary | ICD-10-CM

## 2020-08-11 DIAGNOSIS — Z7982 Long term (current) use of aspirin: Secondary | ICD-10-CM | POA: Diagnosis not present

## 2020-08-11 DIAGNOSIS — R400 Somnolence: Secondary | ICD-10-CM

## 2020-08-11 DIAGNOSIS — Z515 Encounter for palliative care: Secondary | ICD-10-CM | POA: Diagnosis not present

## 2020-08-11 DIAGNOSIS — Z66 Do not resuscitate: Secondary | ICD-10-CM | POA: Diagnosis present

## 2020-08-11 DIAGNOSIS — A419 Sepsis, unspecified organism: Secondary | ICD-10-CM | POA: Diagnosis present

## 2020-08-11 DIAGNOSIS — U071 COVID-19: Secondary | ICD-10-CM | POA: Diagnosis present

## 2020-08-11 DIAGNOSIS — Z79899 Other long term (current) drug therapy: Secondary | ICD-10-CM | POA: Diagnosis not present

## 2020-08-11 DIAGNOSIS — I272 Pulmonary hypertension, unspecified: Secondary | ICD-10-CM

## 2020-08-11 DIAGNOSIS — R6520 Severe sepsis without septic shock: Secondary | ICD-10-CM | POA: Diagnosis present

## 2020-08-11 DIAGNOSIS — Z7189 Other specified counseling: Secondary | ICD-10-CM

## 2020-08-11 DIAGNOSIS — Z87891 Personal history of nicotine dependence: Secondary | ICD-10-CM | POA: Diagnosis not present

## 2020-08-11 DIAGNOSIS — Z85820 Personal history of malignant melanoma of skin: Secondary | ICD-10-CM | POA: Diagnosis not present

## 2020-08-11 DIAGNOSIS — Z789 Other specified health status: Secondary | ICD-10-CM

## 2020-08-11 DIAGNOSIS — I10 Essential (primary) hypertension: Secondary | ICD-10-CM | POA: Diagnosis present

## 2020-08-11 DIAGNOSIS — A4189 Other specified sepsis: Secondary | ICD-10-CM | POA: Diagnosis present

## 2020-08-11 DIAGNOSIS — I639 Cerebral infarction, unspecified: Secondary | ICD-10-CM | POA: Insufficient documentation

## 2020-08-11 DIAGNOSIS — I6310 Cerebral infarction due to embolism of unspecified precerebral artery: Secondary | ICD-10-CM

## 2020-08-11 DIAGNOSIS — Z9981 Dependence on supplemental oxygen: Secondary | ICD-10-CM | POA: Diagnosis not present

## 2020-08-11 DIAGNOSIS — G9341 Metabolic encephalopathy: Secondary | ICD-10-CM | POA: Diagnosis present

## 2020-08-11 DIAGNOSIS — E43 Unspecified severe protein-calorie malnutrition: Secondary | ICD-10-CM | POA: Diagnosis present

## 2020-08-11 DIAGNOSIS — J189 Pneumonia, unspecified organism: Secondary | ICD-10-CM | POA: Insufficient documentation

## 2020-08-11 DIAGNOSIS — Z7984 Long term (current) use of oral hypoglycemic drugs: Secondary | ICD-10-CM | POA: Diagnosis not present

## 2020-08-11 DIAGNOSIS — J159 Unspecified bacterial pneumonia: Secondary | ICD-10-CM | POA: Diagnosis present

## 2020-08-11 DIAGNOSIS — R64 Cachexia: Secondary | ICD-10-CM | POA: Diagnosis present

## 2020-08-11 DIAGNOSIS — J1282 Pneumonia due to coronavirus disease 2019: Secondary | ICD-10-CM | POA: Diagnosis present

## 2020-08-11 DIAGNOSIS — J9621 Acute and chronic respiratory failure with hypoxia: Secondary | ICD-10-CM | POA: Diagnosis present

## 2020-08-11 DIAGNOSIS — D6489 Other specified anemias: Secondary | ICD-10-CM | POA: Diagnosis present

## 2020-08-11 DIAGNOSIS — J440 Chronic obstructive pulmonary disease with acute lower respiratory infection: Secondary | ICD-10-CM | POA: Diagnosis present

## 2020-08-11 DIAGNOSIS — Z7951 Long term (current) use of inhaled steroids: Secondary | ICD-10-CM | POA: Diagnosis not present

## 2020-08-11 DIAGNOSIS — Z7952 Long term (current) use of systemic steroids: Secondary | ICD-10-CM | POA: Diagnosis not present

## 2020-08-11 LAB — URINALYSIS, ROUTINE W REFLEX MICROSCOPIC
Bilirubin Urine: NEGATIVE
Glucose, UA: NEGATIVE mg/dL
Ketones, ur: 5 mg/dL — AB
Leukocytes,Ua: NEGATIVE
Nitrite: NEGATIVE
Protein, ur: 30 mg/dL — AB
Specific Gravity, Urine: 1.014 (ref 1.005–1.030)
pH: 5 (ref 5.0–8.0)

## 2020-08-11 LAB — BLOOD CULTURE ID PANEL (REFLEXED) - BCID2

## 2020-08-11 LAB — CBC WITH DIFFERENTIAL/PLATELET
Abs Immature Granulocytes: 0.12 10*3/uL — ABNORMAL HIGH (ref 0.00–0.07)
Basophils Absolute: 0 10*3/uL (ref 0.0–0.1)
Basophils Relative: 0 %
Eosinophils Absolute: 0 10*3/uL (ref 0.0–0.5)
Eosinophils Relative: 0 %
HCT: 34.7 % — ABNORMAL LOW (ref 39.0–52.0)
Hemoglobin: 10.9 g/dL — ABNORMAL LOW (ref 13.0–17.0)
Immature Granulocytes: 1 %
Lymphocytes Relative: 2 %
Lymphs Abs: 0.4 10*3/uL — ABNORMAL LOW (ref 0.7–4.0)
MCH: 27.4 pg (ref 26.0–34.0)
MCHC: 31.4 g/dL (ref 30.0–36.0)
MCV: 87.2 fL (ref 80.0–100.0)
Monocytes Absolute: 0.5 10*3/uL (ref 0.1–1.0)
Monocytes Relative: 3 %
Neutro Abs: 15.1 10*3/uL — ABNORMAL HIGH (ref 1.7–7.7)
Neutrophils Relative %: 94 %
Platelets: 246 10*3/uL (ref 150–400)
RBC: 3.98 MIL/uL — ABNORMAL LOW (ref 4.22–5.81)
RDW: 14.5 % (ref 11.5–15.5)
WBC Morphology: INCREASED
WBC: 16 10*3/uL — ABNORMAL HIGH (ref 4.0–10.5)
nRBC: 0 % (ref 0.0–0.2)

## 2020-08-11 LAB — COMPREHENSIVE METABOLIC PANEL
ALT: 23 U/L (ref 0–44)
AST: 50 U/L — ABNORMAL HIGH (ref 15–41)
Albumin: 2.3 g/dL — ABNORMAL LOW (ref 3.5–5.0)
Alkaline Phosphatase: 63 U/L (ref 38–126)
Anion gap: 17 — ABNORMAL HIGH (ref 5–15)
BUN: 30 mg/dL — ABNORMAL HIGH (ref 8–23)
CO2: 30 mmol/L (ref 22–32)
Calcium: 7.9 mg/dL — ABNORMAL LOW (ref 8.9–10.3)
Chloride: 92 mmol/L — ABNORMAL LOW (ref 98–111)
Creatinine, Ser: 1.45 mg/dL — ABNORMAL HIGH (ref 0.61–1.24)
GFR, Estimated: 46 mL/min — ABNORMAL LOW (ref >60)
Glucose, Bld: 95 mg/dL (ref 70–99)
Potassium: 3.2 mmol/L — ABNORMAL LOW (ref 3.5–5.1)
Sodium: 139 mmol/L (ref 135–145)
Total Bilirubin: 1.3 mg/dL — ABNORMAL HIGH (ref 0.3–1.2)
Total Protein: 5.7 g/dL — ABNORMAL LOW (ref 6.5–8.1)

## 2020-08-11 LAB — C-REACTIVE PROTEIN: CRP: 23.1 mg/dL — ABNORMAL HIGH (ref <1.0)

## 2020-08-11 LAB — RESP PANEL BY RT-PCR (FLU A&B, COVID) ARPGX2
Influenza A by PCR: NEGATIVE
Influenza B by PCR: NEGATIVE
SARS Coronavirus 2 by RT PCR: POSITIVE — AB

## 2020-08-11 LAB — CBG MONITORING, ED
Glucose-Capillary: 86 mg/dL (ref 70–99)
Glucose-Capillary: 75 mg/dL (ref 70–99)
Glucose-Capillary: 131 mg/dL — ABNORMAL HIGH (ref 70–99)
Glucose-Capillary: 130 mg/dL — ABNORMAL HIGH (ref 70–99)
Glucose-Capillary: 144 mg/dL — ABNORMAL HIGH (ref 70–99)

## 2020-08-11 LAB — LIPID PANEL
Cholesterol: 111 mg/dL (ref 0–200)
HDL: 44 mg/dL (ref >40)
LDL Cholesterol: 53 mg/dL (ref 0–99)
Total CHOL/HDL Ratio: 2.5 RATIO
Triglycerides: 72 mg/dL (ref <150)
VLDL: 14 mg/dL (ref 0–40)

## 2020-08-11 LAB — LACTIC ACID, PLASMA
Lactic Acid, Venous: 10.3 mmol/L (ref 0.5–1.9)
Lactic Acid, Venous: 1.3 mmol/L (ref 0.5–1.9)
Lactic Acid, Venous: 1.1 mmol/L (ref 0.5–1.9)

## 2020-08-11 LAB — PROTIME-INR
INR: 1 (ref 0.8–1.2)
Prothrombin Time: 12.7 seconds (ref 11.4–15.2)

## 2020-08-11 LAB — TROPONIN I (HIGH SENSITIVITY)
Troponin I (High Sensitivity): 97 ng/L — ABNORMAL HIGH (ref <18)
Troponin I (High Sensitivity): 104 ng/L (ref <18)

## 2020-08-11 LAB — FERRITIN: Ferritin: 725 ng/mL — ABNORMAL HIGH (ref 24–336)

## 2020-08-11 LAB — HEMOGLOBIN A1C
Hgb A1c MFr Bld: 5.9 % — ABNORMAL HIGH (ref 4.8–5.6)
Mean Plasma Glucose: 122.63 mg/dL

## 2020-08-11 LAB — PROCALCITONIN: Procalcitonin: 2.32 ng/mL

## 2020-08-11 LAB — D-DIMER, QUANTITATIVE: D-Dimer, Quant: 1.66 ug/mL-FEU — ABNORMAL HIGH (ref 0.00–0.50)

## 2020-08-11 LAB — LACTATE DEHYDROGENASE: LDH: 194 U/L — ABNORMAL HIGH (ref 98–192)

## 2020-08-11 LAB — APTT: aPTT: 41 seconds — ABNORMAL HIGH (ref 24–36)

## 2020-08-11 MED ORDER — ASCORBIC ACID 500 MG PO TABS: 500.0000 mg | ORAL_TABLET | Freq: Every day | ORAL | Status: DC

## 2020-08-11 MED ORDER — LINAGLIPTIN 5 MG PO TABS: 5.0000 mg | ORAL_TABLET | Freq: Every day | ORAL | Status: DC

## 2020-08-11 MED ORDER — ACETAMINOPHEN 650 MG RE SUPP: 650.0000 mg | RECTAL | Status: DC | PRN

## 2020-08-11 MED ORDER — LACTATED RINGERS IV BOLUS (SEPSIS)
1000.0000 mL | Freq: Once | INTRAVENOUS | Status: AC
  Administered 2020-08-11: 04:00:00 1000 mL via INTRAVENOUS

## 2020-08-11 MED ORDER — ACETAMINOPHEN 160 MG/5ML PO SOLN: 650.0000 mg | ORAL | Status: DC | PRN

## 2020-08-11 MED ORDER — INSULIN ASPART 100 UNIT/ML ~~LOC~~ SOLN: 0.0000 [IU] | Freq: Three times a day (TID) | SUBCUTANEOUS | Status: DC

## 2020-08-11 MED ORDER — LACTATED RINGERS IV SOLN: INTRAVENOUS | Status: DC

## 2020-08-11 MED ORDER — VANCOMYCIN HCL IN DEXTROSE 1-5 GM/200ML-% IV SOLN
1000.0000 mg | Freq: Once | INTRAVENOUS | Status: AC
  Administered 2020-08-11: 04:00:00 1000 mg via INTRAVENOUS
  Filled 2020-08-11: qty 200

## 2020-08-11 MED ORDER — SODIUM CHLORIDE 0.9 % IV SOLN
2.0000 g | INTRAVENOUS | Status: DC
  Administered 2020-08-11 – 2020-08-12 (×2): 2 g via INTRAVENOUS
  Filled 2020-08-11 (×2): qty 20

## 2020-08-11 MED ORDER — ZINC SULFATE 220 (50 ZN) MG PO CAPS: 220.0000 mg | ORAL_CAPSULE | Freq: Every day | ORAL | Status: DC

## 2020-08-11 MED ORDER — ALBUTEROL SULFATE HFA 108 (90 BASE) MCG/ACT IN AERS
2.0000 | INHALATION_SPRAY | Freq: Four times a day (QID) | RESPIRATORY_TRACT | Status: DC
  Administered 2020-08-12 – 2020-08-13 (×5): 2 via RESPIRATORY_TRACT
  Filled 2020-08-11: qty 6.7

## 2020-08-11 MED ORDER — MORPHINE SULFATE (PF) 2 MG/ML IV SOLN
2.0000 mg | INTRAVENOUS | Status: DC | PRN
  Administered 2020-08-11 – 2020-08-13 (×2): 2 mg via INTRAVENOUS
  Filled 2020-08-11 (×2): qty 1

## 2020-08-11 MED ORDER — INSULIN DETEMIR 100 UNIT/ML ~~LOC~~ SOLN
0.1500 [IU]/kg | Freq: Two times a day (BID) | SUBCUTANEOUS | Status: DC
  Administered 2020-08-12 (×2): 8 [IU] via SUBCUTANEOUS
  Filled 2020-08-11 (×6): qty 0.08

## 2020-08-11 MED ORDER — DEXTROSE 10 % IV SOLN: INTRAVENOUS | Status: DC

## 2020-08-11 MED ORDER — ASPIRIN 300 MG RE SUPP
300.0000 mg | Freq: Every day | RECTAL | Status: DC
  Administered 2020-08-11 – 2020-08-13 (×3): 300 mg via RECTAL
  Filled 2020-08-11 (×3): qty 1

## 2020-08-11 MED ORDER — DEXAMETHASONE SODIUM PHOSPHATE 10 MG/ML IJ SOLN
6.0000 mg | INTRAMUSCULAR | Status: DC
  Administered 2020-08-11 – 2020-08-13 (×3): 6 mg via INTRAVENOUS
  Filled 2020-08-11 (×3): qty 1

## 2020-08-11 MED ORDER — SODIUM CHLORIDE 0.9 % IV SOLN
2.0000 g | Freq: Once | INTRAVENOUS | Status: AC
  Administered 2020-08-11: 03:00:00 2 g via INTRAVENOUS
  Filled 2020-08-11: qty 2

## 2020-08-11 MED ORDER — ACETAMINOPHEN 325 MG PO TABS: 650.0000 mg | ORAL_TABLET | ORAL | Status: DC | PRN

## 2020-08-11 MED ORDER — ENOXAPARIN SODIUM 40 MG/0.4ML ~~LOC~~ SOLN
40.0000 mg | Freq: Every day | SUBCUTANEOUS | Status: DC
  Administered 2020-08-11 – 2020-08-13 (×3): 40 mg via SUBCUTANEOUS
  Filled 2020-08-11 (×3): qty 0.4

## 2020-08-11 MED ORDER — PIPERACILLIN-TAZOBACTAM 4.5 G IVPB: 4.5000 g | Freq: Once | INTRAVENOUS | Status: DC

## 2020-08-11 MED ORDER — IOHEXOL 300 MG/ML  SOLN
100.0000 mL | Freq: Once | INTRAMUSCULAR | Status: AC | PRN
  Administered 2020-08-11: 100 mL via INTRAVENOUS

## 2020-08-11 MED ORDER — SODIUM CHLORIDE 0.9 % IV SOLN
200.0000 mg | Freq: Once | INTRAVENOUS | Status: AC
  Administered 2020-08-11: 10:00:00 200 mg via INTRAVENOUS
  Filled 2020-08-11: qty 40

## 2020-08-11 MED ORDER — SODIUM CHLORIDE 0.9 % IV SOLN
500.0000 mg | INTRAVENOUS | Status: DC
  Administered 2020-08-11 – 2020-08-13 (×3): 500 mg via INTRAVENOUS
  Filled 2020-08-11 (×4): qty 500

## 2020-08-11 MED ORDER — METOPROLOL TARTRATE 5 MG/5ML IV SOLN
5.0000 mg | INTRAVENOUS | Status: DC | PRN
  Administered 2020-08-11 – 2020-08-13 (×4): 5 mg via INTRAVENOUS
  Filled 2020-08-11 (×5): qty 5

## 2020-08-11 MED ORDER — INSULIN ASPART 100 UNIT/ML ~~LOC~~ SOLN: 4.0000 [IU] | Freq: Three times a day (TID) | SUBCUTANEOUS | Status: DC

## 2020-08-11 MED ORDER — LACTATED RINGERS IV SOLN: INTRAVENOUS | Status: AC

## 2020-08-11 MED ORDER — INSULIN ASPART 100 UNIT/ML ~~LOC~~ SOLN
0.0000 [IU] | SUBCUTANEOUS | Status: DC
  Administered 2020-08-11: 23:00:00 1 [IU] via SUBCUTANEOUS

## 2020-08-11 MED ORDER — STROKE: EARLY STAGES OF RECOVERY BOOK: Freq: Once | Status: DC

## 2020-08-11 MED ORDER — SODIUM CHLORIDE 0.9 % IV SOLN
100.0000 mg | Freq: Every day | INTRAVENOUS | Status: DC
  Administered 2020-08-12 – 2020-08-13 (×2): 100 mg via INTRAVENOUS
  Filled 2020-08-11: qty 20
  Filled 2020-08-11: qty 100

## 2020-08-11 MED ORDER — ASPIRIN 325 MG PO TABS: 325.0000 mg | ORAL_TABLET | Freq: Every day | ORAL | Status: DC

## 2020-08-11 NOTE — Progress Notes (Addendum)
PROGRESS NOTE  Alejandro Hall ASN:[id number] DOB: 07-Aug-1929 DOA: 08/15/2020 PCP: Alejandro Funk, MD  Brief History   The patient is a 86 yr old man who lives at home with his son, Alejandro Hall. He carries a past medical history significant for arthritis, COPD, DM II, hypertension, seizure disorder, and history of kidney stones.  The patient was brought to [facility] on 07/27/2020 by his son for increasing confusion, inability to ambulate on the advise of the patient's PCP.   In the ED the patient was oriented to name only. He was found to be hypoxic and was able to move all extremities. The patient was discussed with Dr. Otelia Limes.  Although CT head was suggestive of acute vs subacute stroke, MRI brain was negative for CVA. Dr. Otelia Limes did not feel that the patient's presentation was consistent with CVA. CXR demonstrated diffuse airspace disease in the left thorax suspicious for acute pneumonia. The patient was started on IV cefepime and azithromycin. COVID 19 testing came back positive.  The patient has been evaluated by SLP, and he has been found incapable of managing his own secretions. He is now NPO. Blood sugars are supported with D10.  The patient has been placed on the COVID-19 protocol. His respiratory and mental status have declined since presentation. He is DNR, but I have discussed the patient with his son, Alejandro Hall, and told him that I felt that comfort care would be appropriate if the patient continues to decline as he is has very little reserve on which to rely on in the face of this overwhelming illness.  Consultants  . Neurology  Procedures  . None  Antibiotics   Anti-infectives (From admission, onward)   Start     Dose/Rate Route Frequency Ordered Stop   08/12/20 1000  remdesivir 100 mg in sodium chloride 0.9 % 100 mL IVPB       "Followed by" Linked Group Details   100 mg 200 mL/hr over 30 Minutes Intravenous Daily 08/11/20 0900 08-19-20 0959   08/11/20 2200  cefTRIAXone (ROCEPHIN) 2 g  in sodium chloride 0.9 % 100 mL IVPB        2 g 200 mL/hr over 30 Minutes Intravenous Every 24 hours 08/11/20 0536     08/11/20 0845  remdesivir 200 mg in sodium chloride 0.9% 250 mL IVPB       "Followed by" Linked Group Details   200 mg 580 mL/hr over 30 Minutes Intravenous Once 08/11/20 0900 08/11/20 1123   08/11/20 0800  azithromycin (ZITHROMAX) 500 mg in sodium chloride 0.9 % 250 mL IVPB        500 mg 250 mL/hr over 60 Minutes Intravenous Every 24 hours 08/11/20 0536     08/11/20 0315  ceFEPIme (MAXIPIME) 2 g in sodium chloride 0.9 % 100 mL IVPB        2 g 200 mL/hr over 30 Minutes Intravenous  Once 08/11/20 0310 08/11/20 0416   08/11/20 0315  vancomycin (VANCOCIN) IVPB 1000 mg/200 mL premix        1,000 mg 200 mL/hr over 60 Minutes Intravenous  Once 08/11/20 0310 08/11/20 0445   08/11/20 0315  piperacillin-tazobactam (ZOSYN) IVPB 4.5 g  Status:  Discontinued        4.5 g 200 mL/hr over 30 Minutes Intravenous  Once 08/11/20 0310 08/11/20 0316    .  Subjective  The patient is moribund. He is minimally responsive to stimulation. He is moving all extremities.   Objective   Vitals:  Vitals:   08/11/20  1330 08/11/20 1345  BP: (!) 138/57 (!) 146/94  Pulse: 77 81  Resp: (!) 22 19  Temp:    SpO2: 100% 100%   Exam:  Constitutional:  The patient is moribund, minimally responsive.  Marland Kitchen  Respiratory:  . Positive for increased work of breathing. Marland Kitchen Positive for diffuse rales left greater than right. . No tactile fremitus Cardiovascular:  . Regular rate and rhythm, tachycardic . No murmurs, ectopy, or gallups. . No lateral PMI. No thrills. Abdomen:  . Abdomen is soft, non-tender, non-distended . Scaffoid . No hernias, masses, or organomegaly . Normoactive bowel sounds.  Musculoskeletal:  . No cyanosis, clubbing, or edema Skin:  . There are stage II decubitus ulcers on the patient's ischiums bilaterally and shoulders bilaterally. . No edema, no rashes. . palpation of skin:  no induration or nodules Neurologic:  . Unable to evaluate due to the patient's inability to cooperate with exam. Psychiatric:  Unable to evaluate due to the patient's inability to cooperate with exam.  I have personally reviewed the following:   Today's Data  . Vitals, CMP, CBC, Procalcitonin, Lactic Acid, Ferritin, CRP, D Dimer, LDH, Troponin  Micro Data  . Blood cultures x 2: No growth . COVID-19: Positive  Imaging  . CXR . CT abdomen and pelvis . CT head . MRI Brain  Cardiology Data  . EKG  Scheduled Meds: .  stroke: mapping our early stages of recovery book   Does not apply Once  . albuterol  2 puff Inhalation Q6H  . aspirin  300 mg Rectal Daily  . dexamethasone (DECADRON) injection  6 mg Intravenous Q24H  . enoxaparin (LOVENOX) injection  40 mg Subcutaneous Daily  . insulin aspart  0-20 Units Subcutaneous TID WC  . insulin aspart  0-9 Units Subcutaneous Q4H  . insulin aspart  4 Units Subcutaneous TID WC  . insulin detemir  0.15 Units/kg Subcutaneous BID   Continuous Infusions: . azithromycin Stopped (08/11/20 0940)  . cefTRIAXone (ROCEPHIN)  IV    . dextrose 30 mL/hr at 08/11/20 1159  . lactated ringers Stopped (08/11/20 1153)  . lactated ringers 100 mL/hr at 08/11/20 1153  . [START ON 08/12/2020] remdesivir 100 mg in NS 100 mL      Principal Problem:   Sepsis (HCC) Active Problems:   HTN (hypertension)   Diabetes mellitus type 2, uncomplicated (HCC)   Pulmonary HTN (HCC)   Acute CVA (cerebrovascular accident) (HCC)   Severe sepsis (HCC)   LOS: 0 days   A & P  Severe Sepsis: Tachycardia, tachypnea, hypotension, altered mental status, elevated creatinine, elevated WBC, in the setting of covid pneumonia. The patient is on a sepsis protocol. Lactic acid has trended downward from 1.5 to 1.3 to 1.0.   COVID pneumonia: The patient is receiving remdesivir, IV steroids, supplemental O2, vitamin C, zinc, nebulizer treatments. Supplemental O2. Given increased  procalcitonin will continue IV antibiotics for now. His respiratory and mental status have declined since presentation. He is DNR, but I have discussed the patient with his son, Alejandro Hall, and told him that I felt that comfort care would be appropriate if the patient continues to decline as he is has very little reserve on which to rely on in the face of this overwhelming illness.  Acute respiratory failure; The patient is currently saturating 100% on 6L O2 by . Due to COVID pneumonia in the setting of COPD and pulmonary hypertension.  DM II: Hb A1c was 5.9. Will anticipate elevated glucoses due to IV decadron. Glucoses  will be followed by FSBS and SSI. Depending on insulin requirements consider starting lantus after 24 hours.  Severe protein calorie malnutrtion: Evident upon presentation. Pt is now NPO due to his severe dysphagia. Will consult nutrition for their on advise on how to best support this severely malnurished elderly man.  Acute CVA: No reliable evidence of exam findings to support diagnosis of acute CVA. Will stop NIH scale screenings.  Pulmonary Hypertension: Noted. Complicates care of respiratory status and hypoxia.  I have seen and examined this patient myself. I have spent 52 minutes in his evaluation and care.  DVT Prophylaxis: Lovenox CODE STATUS: DNR. Have consulted palliative care to manage likely transition to comfort care when patient's status worsens. I have discussed this with the patient's son, Alejandro Hall. He did not discuss his decision with me during that conversation. Family Communication: I have discussed the patient with Alejandro Hall, the patient's son.  Disposition: Status is: Inpatient  Remains inpatient appropriate because:Inpatient level of care appropriate due to severity of illness   Dispo: The patient is from: Home              Anticipated d/c is to: TBD              Anticipated d/c date is: 3 days              Patient currently is not medically stable to d/c.    Taneshia Lorence, DO [facility] Direct contact: see [email or web address]  7PM-7AM contact night coverage as above 08/11/2020, 2:28 PM  LOS: 0 days   ADDENDUM: I have discussed the patient with palliative care. They will see the patient today.

## 2020-08-11 NOTE — ED Notes (Signed)
Patient transported to MRI 

## 2020-08-11 NOTE — Consult Note (Signed)
Consultation Note Date: 08/11/2020   Patient Name: Alejandro Hall  DOB: 12/09/1928  MRN: [id number]  Age / Sex: 86 y.o., male  PCP: Alejandro FunkGriffin, John, MD Referring Physician: Fran LowesSwayze, Ava, DO  Reason for Consultation: Establishing goals of care  HPI/Patient Profile: 86 y.o. male  with past medical history of seizures, DM, COPD on chronic 3L O2, and hypertension presented to the ED on 12/27/19 from home with his son stating he had increased AMS. Patient is being admitted on Dec 21, 2019 with COVID pneumonia, severe sepsis, acute respiratory failure, and severe protein calorie malnutrition with albumin 2.3 on 08/11/20.  ED Course:Patient was oriented to name only. He was found to be hypoxic and was able to move all extremities. Although CT head was suggestive of acute vs subacute stroke, MRI brain was negative for CVA. Dr. Otelia LimesLindzen did not feel that the patient's presentation was consistent with CVA. CXR demonstrated diffuse airspace disease in the left thorax suspicious for acute pneumonia. The patient was started on IV cefepime and azithromycin. COVID 19 testing came back positive.  The patient has been evaluated by SLP, and he has been found incapable of managing his own secretions. He is now NPO.    Family face treatment option decisions and anticipatory care needs.  Clinical Assessment and Goals of Care: I have reviewed medical records including EPIC notes, labs, and imaging. Received report from primary RN - no acute concerns.   Went to visit patient at bedside - no family/visitors present. Patient was lying in bed awake, alert, disoriented x4, and unable able to participate in conversation.Patient states "I don't feel good." He denies pain. No signs or non-verbal gestures of pain or discomfort noted. No respiratory distress or secretions noted; increased work of breathing was noted. Patient was on 5L O2 [place].   Called  son/Alejandro Hall to discuss diagnosis, prognosis, GOC, EOL wishes, disposition, and options.  I introduced Palliative Medicine as specialized medical care for people living with serious illness. It focuses on providing relief from the symptoms and stress of a serious illness. The goal is to improve quality of life for both the patient and the family.  We discussed a brief life review of the patient as well as functional and nutritional status. The patient's wife passed away in 2009 - both the patient and his wife had children from previous marriages. The patient has 1 son, whom is his primary caregiver, and 3 daughters whom are in their 2860s. Prior to hospitalization, the patient was living in a private residence with his son/Alejandro Hall and his daughter in law - they have lived together for many years. Alejandro Hall tells me that for a "long time" the patient has taken a long time to eat meals - sometimes greater than 2 hours. For the most part, however, the patient has been functionally independent - able to ambulate without assistance, bathe himself, feed himself, take small walks to see his neighbors. However, last Thursday Alejandro Hall saw a drastic decline in the patient. It was at this time the patient stopped drinking,  had minimal PO intake, needed help bathing and walking.   We discussed patient's current illness and what it means in the larger context of patient's on-going co-morbidities. Alejandro Browns clearly understands the patient's current medical condition. He also understands that COPD is a progressive, non-curable disease underlying the patient's current acute medical conditions. Natural disease trajectory and expectations at EOL were discussed. I attempted to elicit values and goals of care important to the patient. The difference between aggressive medical intervention and comfort care was considered in light of the patient's goals of care. Medical recommendation was given for comfort care at this time, as it is  unlikely the patient will survive his acute illness in his frail state. We talked about transition to comfort measures in house and what that would entail inclusive of medications to control pain, dyspnea, agitation, nausea, itching, and hiccups. We discussed stopping all uneccessary measures such as blood draws, needle sticks, oxygen, antibiotics, CBGs/insulin, cardiac monitoring, and frequent vital signs. Alejandro Browns expressed that he does not want the patient suffer. Therapeutic listening was provided as he reflected on conversations with the patient where he stated he would not ever want to be a burden on anyone. Encouraged Alejandro Browns to continue to make decisions based on conversations he'd had with the patient/what his wishes would be for himself. Alejandro Browns expressed that he is open to comfort care, but asked if we could give him time to see if he would improve. Discussed watchful waiting and encouraged a time limit to monitor for improvement or decline. Alejandro Browns was agreeable to 24 hours of watchful waiting to give the patient "a chance." He explained that he he didn't give the patient a chance he wouldn't be sure he could forgive himself in the future. Validation and emotional support provided for his decision.   Advance directives were considered and discussed. Alejandro Browns states that the patient has a HCPOA and possibly a living will - he will search for documents. He tells me that he is the healthcare proxy for the patient. Alejandro Browns also states that the patient would not want heroic measures performed - continue DNR/DNI.  Visit also consisted of discussions dealing with the complex and emotionally intense issues of symptom management and palliative care in the setting of serious and potentially life-threatening illness. Palliative care team will continue to support patient, patient's family, and medical team.  Discussed with family the importance of continued conversation with each other and the medical providers  regarding overall plan of care and treatment options, ensuring decisions are within the context of the patient's values and GOCs.    Questions and concerns were addressed. The patient/family was encouraged to call with questions and/or concerns. PMT number was provided.   Primary Decision Maker: NEXT OF KIN  Son/Alejandro Hall Tarver is primary caregiver    SUMMARY OF RECOMMENDATIONS:  Continue current medical treatment with 24 hours of watchful waiting  Continue DNR/DNI as previously documented  Son wants to give 24 hours of watchful waiting - if no improvement or decline within this time, he seems open to initiating comfort care   Started morphine 2mg  IV q4h for severe pain or dyspnea  PMT will continue to follow holistically  Code Status/Advance Care Planning:  DNR  Palliative Prophylaxis:   Aspiration, Bowel Regimen, Delirium Protocol, Frequent Pain Assessment, Oral Care and Turn Reposition  Additional Recommendations (Limitations, Scope, Preferences):  Full Scope Treatment  Psycho-social/Spiritual:   Desire for further Chaplaincy support:no Created space and opportunity for patient and family to express thoughts and feelings  regarding patient's current medical situation.   Emotional support and therapeutic listening provided.  Prognosis:   < 6 months Poor in context of patient's acute medical situation, age, chronic illnesses, and comorbidities  Discharge Planning: To Be Determined      Primary Diagnoses: Present on Admission: . Acute CVA (cerebrovascular accident) (HCC) . Sepsis (HCC) . HTN (hypertension) . Pulmonary HTN (HCC) . Severe sepsis (HCC)   I have reviewed the medical record, interviewed the patient and family, and examined the patient. The following aspects are pertinent.  Past Medical History:  Diagnosis Date  . Arthritis   . COPD (chronic obstructive pulmonary disease) (HCC)   . Diabetes mellitus without complication (HCC)   . History of  kidney stones   . Hypertension   . Seizures (HCC)   . Shortness of breath dyspnea    Social History   Socioeconomic History  . Marital status: Widowed    Spouse name: Not on file  . Number of children: Not on file  . Years of education: Not on file  . Highest education level: Not on file  Occupational History  . Not on file  Tobacco Use  . Smoking status: Former Games developer  . Smokeless tobacco: Never Used  . Tobacco comment: quit smoking in 1982  Substance and Sexual Activity  . Alcohol use: No  . Drug use: No  . Sexual activity: Not on file  Other Topics Concern  . Not on file  Social History Narrative  . Not on file   Social Determinants of Health   Financial Resource Strain: Not on file  Food Insecurity: Not on file  Transportation Needs: Not on file  Physical Activity: Not on file  Stress: Not on file  Social Connections: Not on file   Family History  Problem Relation Age of Onset  . Diabetes Mellitus II Neg Hx   . Hypertension Neg Hx    Scheduled Meds: . albuterol  2 puff Inhalation Q6H  . aspirin  300 mg Rectal Daily  . dexamethasone (DECADRON) injection  6 mg Intravenous Q24H  . enoxaparin (LOVENOX) injection  40 mg Subcutaneous Daily  . insulin aspart  0-20 Units Subcutaneous TID WC  . insulin aspart  0-9 Units Subcutaneous Q4H  . insulin aspart  4 Units Subcutaneous TID WC  . insulin detemir  0.15 Units/kg Subcutaneous BID   Continuous Infusions: . azithromycin Stopped (08/11/20 0940)  . cefTRIAXone (ROCEPHIN)  IV    . dextrose 30 mL/hr at 08/11/20 1159  . lactated ringers Stopped (08/11/20 1153)  . lactated ringers 100 mL/hr at 08/11/20 1153  . [START ON 08/12/2020] remdesivir 100 mg in NS 100 mL     PRN Meds:.[DISCONTINUED] acetaminophen **OR** acetaminophen (TYLENOL) oral liquid 160 mg/5 mL **OR** acetaminophen Medications Prior to Admission:  Prior to Admission medications   Medication Sig Start Date End Date Taking? Authorizing Provider   acetaminophen (TYLENOL) 500 MG tablet Take 1,000 mg by mouth every 6 (six) hours as needed for mild pain, fever or headache.   Yes [provider]  albuterol (PROVENTIL HFA;VENTOLIN HFA) 108 (90 BASE) MCG/ACT inhaler Inhale 2 puffs into the lungs every 6 (six) hours as needed for wheezing or shortness of breath. 08/17/14  Yes Penny Pia, MD  amLODipine (NORVASC) 10 MG tablet Take 1 tablet (10 mg total) by mouth daily. 08/18/14  Yes Penny Pia, MD  atorvastatin (LIPITOR) 10 MG tablet Take 10 mg by mouth daily. 08/09/15  Yes [provider]  Ginkgo Biloba 40  MG TABS Take 1 tablet by mouth daily.   Yes [provider]  metFORMIN (GLUCOPHAGE) 1000 MG tablet Take 1,000 mg by mouth daily. 01/17/16  Yes [provider]  Multiple Vitamins-Minerals (MULTIVITAMIN ADULTS 50+) TABS Take 1 tablet by mouth daily.   Yes [provider]  sertraline (ZOLOFT) 50 MG tablet Take 50 mg by mouth daily. 07/19/20  Yes [provider]  aspirin EC 81 MG EC tablet Take 1 tablet (81 mg total) by mouth daily. Patient not taking: No sig reported 08/17/14   Penny Pia, MD  glucose blood test strip Use as instructed 10/26/15   Christiane Ha, MD   No Known Allergies Review of Systems  Unable to perform ROS: Acuity of condition    Physical Exam Vitals and nursing note reviewed.  Constitutional:      General: He is not in acute distress.    Appearance: He is cachectic. He is ill-appearing.  Pulmonary:     Effort: No respiratory distress.     Comments: Increased work of breathing Skin:    General: Skin is warm and dry.     Findings: Wound present.  Neurological:     Mental Status: He is alert. He is disoriented and confused.     Motor: Weakness present.  Psychiatric:        Behavior: Behavior is slowed.        Cognition and Memory: Cognition is impaired. Memory is impaired.     Vital Signs: BP (!) 151/72   Pulse 80   Temp 99.8 F (37.7 C)  (Rectal)   Resp (!) 23   Ht 5\' 8"  (1.727 m)   Wt 54.4 kg   SpO2 100%   BMI 18.25 kg/m  Pain Scale: PAINAD       SpO2: SpO2: 100 % O2 Device:SpO2: 100 % O2 Flow Rate: .O2 Flow Rate (L/min): 4 L/min  IO: Intake/output summary:   Intake/Output Summary (Last 24 hours) at 08/11/2020 1615 Last data filed at 08/11/2020 0413 Gross per 24 hour  Intake --  Output 200 ml  Net -200 ml    LBM:   Baseline Weight: Weight: 54.4 kg Most recent weight: Weight: 54.4 kg     Palliative Assessment/Data: PPS 20%     Time In: 1615 Time Out: 1727 Time Total: 72 minutes  Greater than 50%  of this time was spent counseling and coordinating care related to the above assessment and plan.  Signed by: [id number], NP   Please contact Palliative Medicine Team phone at [phone] for questions and concerns.  For individual provider: See [phone]

## 2020-08-11 NOTE — Progress Notes (Signed)
PHARMACY - PHYSICIAN COMMUNICATION CRITICAL VALUE ALERT - BLOOD CULTURE IDENTIFICATION (BCID)  Alejandro Hall is an 86 y.o. male who presented to [facility] on 08-22-2020 with a chief complaint of COVID   Assessment:  GPC in chains   Name of physician (or Provider) Contacted: Elgergawy  Current antibiotics: Ctx   Changes to prescribed antibiotics recommended:  Recommendations accepted by provider Continue ctx and monitor likely containment   Results for orders placed or performed during the hospital encounter of 2020-08-22  Blood Culture ID Panel (Reflexed) (Collected: 2020/08/22  8:36 PM)  Result Value Ref Range   Enterococcus faecalis NOT DETECTED NOT DETECTED   Enterococcus Faecium NOT DETECTED NOT DETECTED   Listeria monocytogenes NOT DETECTED NOT DETECTED   Staphylococcus species NOT DETECTED NOT DETECTED   Staphylococcus aureus (BCID) NOT DETECTED NOT DETECTED   Staphylococcus epidermidis NOT DETECTED NOT DETECTED   Staphylococcus lugdunensis NOT DETECTED NOT DETECTED   Streptococcus species NOT DETECTED NOT DETECTED   Streptococcus agalactiae NOT DETECTED NOT DETECTED   Streptococcus pneumoniae NOT DETECTED NOT DETECTED   Streptococcus pyogenes NOT DETECTED NOT DETECTED   A.calcoaceticus-baumannii NOT DETECTED NOT DETECTED   Bacteroides fragilis NOT DETECTED NOT DETECTED   Enterobacterales NOT DETECTED NOT DETECTED   Enterobacter cloacae complex NOT DETECTED NOT DETECTED   Escherichia coli NOT DETECTED NOT DETECTED   Klebsiella aerogenes NOT DETECTED NOT DETECTED   Klebsiella oxytoca NOT DETECTED NOT DETECTED   Klebsiella pneumoniae NOT DETECTED NOT DETECTED   Proteus species NOT DETECTED NOT DETECTED   Salmonella species NOT DETECTED NOT DETECTED   Serratia marcescens NOT DETECTED NOT DETECTED   Haemophilus influenzae NOT DETECTED NOT DETECTED   Neisseria meningitidis NOT DETECTED NOT DETECTED   Pseudomonas aeruginosa NOT DETECTED NOT DETECTED   Stenotrophomonas  maltophilia NOT DETECTED NOT DETECTED   Candida albicans NOT DETECTED NOT DETECTED   Candida auris NOT DETECTED NOT DETECTED   Candida glabrata NOT DETECTED NOT DETECTED   Candida krusei NOT DETECTED NOT DETECTED   Candida parapsilosis NOT DETECTED NOT DETECTED   Candida tropicalis NOT DETECTED NOT DETECTED   Cryptococcus neoformans/gattii NOT DETECTED NOT DETECTED    Joaquim Lai  PharmD. BCPS  08/12/2020  2:14 PM

## 2020-08-11 NOTE — H&P (Signed)
History and Physical    Alejandro Hall:[id number] DOB: Jun 30, 1929 DOA: 08/24/2020  PCP: Kirby Funk, MD   Patient coming from: Home.  History obtained from patient's son.  Chief Complaint: Difficulty walking and confusion.  HPI: Alejandro Hall is a 86 y.o. male with history of diabetes mellitus type 2, COPD, hypertension was brought to the ER after patient was brought to the ER after patient was found to be increasingly confused and difficulty to ambulate with weakness.  Per patient's son patient was doing fine on Wednesday, August 08, 2020 having his dinner at the table by himself.  Usually ambulates without help.  The following day morning when patient's son returned back patient was finding it to ambulate.  Then on Friday patient became more confused and was unable to ambulate at all and contacted patient's PCP check for UTI but due to worsening symptoms was brought to the ER.  ED Course: In the ER patient was hypotensive and patient was oriented to his name only.  Moving all extremities.  Labs were significant for lactic acid of 3.1 temperature of 99.0 F patient was given fluid bolus for sepsis with sepsis protocol empiric antibiotic started after blood cultures obtained.  There was some concern for abdominal discomfort CT abdomen pelvis was done which was showing possible pneumonia on the left side and also CT head was showing possible acute versus subacute stroke.  Patient admitted for sepsis and stroke.  Discussed with on-call neurologist Dr. Otelia Limes who advised to get MRI brain.  Covid test is pending.  Review of Systems: As per HPI, rest all negative.   Past Medical History:  Diagnosis Date  . Arthritis   . COPD (chronic obstructive pulmonary disease) (HCC)   . Diabetes mellitus without complication (HCC)   . History of kidney stones   . Hypertension   . Seizures (HCC)   . Shortness of breath dyspnea     Past Surgical History:  Procedure Laterality Date  . APPENDECTOMY     . HERNIA REPAIR    . MELANOMA EXCISION     facial moles  . TONSILLECTOMY       reports that he has quit smoking. He has never used smokeless tobacco. He reports that he does not drink alcohol and does not use drugs.  No Known Allergies  Family History  Problem Relation Age of Onset  . Diabetes Mellitus II Neg Hx   . Hypertension Neg Hx     Prior to Admission medications   Medication Sig Start Date End Date Taking? Authorizing Provider  acetaminophen (TYLENOL) 500 MG tablet Take 1,500 mg by mouth at bedtime.    [provider]  albuterol (PROVENTIL HFA;VENTOLIN HFA) 108 (90 BASE) MCG/ACT inhaler Inhale 2 puffs into the lungs every 6 (six) hours as needed for wheezing or shortness of breath. 08/17/14   Penny Pia, MD  amLODipine (NORVASC) 10 MG tablet Take 1 tablet (10 mg total) by mouth daily. 08/18/14   Penny Pia, MD  aspirin EC 81 MG EC tablet Take 1 tablet (81 mg total) by mouth daily. 08/17/14   Penny Pia, MD  atorvastatin (LIPITOR) 10 MG tablet Take 10 mg by mouth daily. 08/09/15   [provider]  azithromycin (ZITHROMAX) 250 MG tablet Take 1 tablet (250 mg total) by mouth daily. Take 1 every day until finished. 01/27/16   Mesner, Barbara Cower, MD  glimepiride (AMARYL) 2 MG tablet Take 2 mg by mouth daily with breakfast.  01/17/16   [provider]  glucose blood test strip Use as instructed 10/26/15   Christiane HaSullivan, Corinna L, MD  metFORMIN (GLUCOPHAGE) 1000 MG tablet Take 1,000 mg by mouth 2 (two) times daily. 01/17/16   [provider]  predniSONE (DELTASONE) 20 MG tablet 2 tabs po daily x 4 days 01/27/16   Mesner, Barbara CowerJason, MD  umeclidinium-vilanterol (ANORO ELLIPTA) 62.5-25 MCG/INH AEPB Inhale 1 puff into the lungs daily.    [provider]    Physical Exam: Constitutional: Moderately built and nourished. Vitals:   08/11/20 0345 08/11/20 0448 08/11/20 0500 08/11/20 0515  BP: 118/60 140/67 136/71 (!) 136/95  Pulse: 84  94 100  Resp:  (!) 29 (!) 29 (!) 27 (!) 30  Temp:      TempSrc:      SpO2: 91%  100% 100%  Weight:      Height:       Eyes: Anicteric no pallor. ENMT: No discharge from the ears eyes nose or mouth. Neck: No mass felt.  No neck rigidity. Respiratory: No rhonchi or crepitations. Cardiovascular: S1-S2 heard. Abdomen: Soft nontender bowel sounds present. Musculoskeletal: No edema. Skin: No rash. Neurologic: Alert awake oriented to his name.  Moving all extremities.  Pupils equal and reacting to light.  No facial asymmetry. Psychiatric: Oriented to his name.   Labs on Admission: I have personally reviewed following labs and imaging studies  CBC: Recent Labs  Lab 08/05/2020 2036  WBC 10.5  NEUTROABS 9.2*  HGB 12.3*  HCT 40.0  MCV 87.9  PLT 343   Basic Metabolic Panel: Recent Labs  Lab 08/15/2020 2036  NA 140  K 3.5  CL 88*  CO2 30  GLUCOSE 98  BUN 27*  CREATININE 1.26*  CALCIUM 8.6*   GFR: Estimated Creatinine Clearance: 30.6 mL/min (A) (by C-G formula based on SCr of 1.26 mg/dL (H)). Liver Function Tests: Recent Labs  Lab 07/26/2020 2036  AST 56*  ALT 27  ALKPHOS 76  BILITOT 0.8  PROT 6.9  ALBUMIN 2.9*   No results for input(s): LIPASE, AMYLASE in the last 168 hours. No results for input(s): AMMONIA in the last 168 hours. Coagulation Profile: Recent Labs  Lab 08/09/2020 2036  INR 1.0   Cardiac Enzymes: No results for input(s): CKTOTAL, CKMB, CKMBINDEX, TROPONINI in the last 168 hours. BNP (last 3 results) No results for input(s): PROBNP in the last 8760 hours. HbA1C: No results for input(s): HGBA1C in the last 72 hours. CBG: No results for input(s): GLUCAP in the last 168 hours. Lipid Profile: No results for input(s): CHOL, HDL, LDLCALC, TRIG, CHOLHDL, LDLDIRECT in the last 72 hours. Thyroid Function Tests: No results for input(s): TSH, T4TOTAL, FREET4, T3FREE, THYROIDAB in the last 72 hours. Anemia Panel: No results for input(s): VITAMINB12, FOLATE, FERRITIN, TIBC,  IRON, RETICCTPCT in the last 72 hours. Urine analysis:    Component Value Date/Time   COLORURINE YELLOW 08/11/2020 0354   APPEARANCEUR HAZY (A) 08/11/2020 0354   LABSPEC 1.014 08/11/2020 0354   PHURINE 5.0 08/11/2020 0354   GLUCOSEU NEGATIVE 08/11/2020 0354   HGBUR MODERATE (A) 08/11/2020 0354   BILIRUBINUR NEGATIVE 08/11/2020 0354   KETONESUR 5 (A) 08/11/2020 0354   PROTEINUR 30 (A) 08/11/2020 0354   UROBILINOGEN 0.2 12/07/2010 0955   NITRITE NEGATIVE 08/11/2020 0354   LEUKOCYTESUR NEGATIVE 08/11/2020 0354   Sepsis Labs: @LABRCNTIP (procalcitonin:4,lacticidven:4) )No results found for this or any previous visit (from the past 240 hour(s)).   Radiological Exams on Admission: DG Chest 2 View  Result Date: 08/19/2020  CLINICAL DATA:  Sepsis altered EXAM: CHEST - 2 VIEW COMPARISON:  01/26/2016 FINDINGS: Hyperinflation. Diffuse airspace disease in the left thorax suspicious for acute pneumonia. Stable cardiomediastinal silhouette with aortic atherosclerosis. No pneumothorax IMPRESSION: Diffuse airspace disease in the left thorax suspicious for acute pneumonia. Electronically Signed   By: Jasmine Pang M.D.   On: 08/17/2020 21:02   CT Head Wo Contrast  Result Date: 08/11/2020 CLINICAL DATA:  Encephalopathy EXAM: CT HEAD WITHOUT CONTRAST TECHNIQUE: Contiguous axial images were obtained from the base of the skull through the vertex without intravenous contrast. COMPARISON:  None. FINDINGS: Brain: There is hypoattenuation within the left brainstem. Diffuse generalized atrophy with periventricular white matter hypoattenuation. No acute hemorrhage. Vascular: Atherosclerotic calcification of the internal carotid arteries at the skull base. No abnormal hyperdensity of the major intracranial arteries or dural venous sinuses. Skull: The visualized skull base, calvarium and extracranial soft tissues are normal. Sinuses/Orbits: Partial opacification of the left maxillary sinus. The orbits are normal.  IMPRESSION: 1. Hypoattenuation within the left brainstem, consistent with acute or subacute infarct. 2. No acute hemorrhage or mass effect. Electronically Signed   By: Deatra Robinson M.D.   On: 08/11/2020 04:49   CT Abdomen Pelvis W Contrast  Result Date: 08/11/2020 CLINICAL DATA:  Sepsis.  Suspected abdominal infection or abscess. EXAM: CT ABDOMEN AND PELVIS WITH CONTRAST TECHNIQUE: Multidetector CT imaging of the abdomen and pelvis was performed using the standard protocol following bolus administration of intravenous contrast. CONTRAST:  OMNIPAQUE IOHEXOL 300 MG/ML  SOLN COMPARISON:  Noncontrast CT on 09/23/2010 FINDINGS: Lower Chest: New airspace disease is seen in the peripheral left lower lobe with tiny left pleural effusion. Emphysema also noted. Hepatobiliary: Probable tiny hepatic cysts noted. Postop changes from previous right hepatectomy and cholecystectomy. No definite liver masses identified. No evidence of biliary ductal dilatation. Pancreas:  No mass or inflammatory changes. Spleen: Within normal limits in size and appearance. Adrenals/Urinary Tract: Stable nodularity of left adrenal gland. A few tiny renal cysts are seen bilaterally. No masses identified. A few tiny 2-3 mm calculi are noted in the left kidney. No evidence of ureteral calculi or hydronephrosis. A few tiny 1-2 mm calculi are also seen in the urinary bladder. Diffuse bladder wall thickening is seen, likely due to chronic bladder outlet obstruction given enlarged prostate. Stomach/Bowel: No evidence of obstruction, inflammatory process or abnormal fluid collections. Vascular/Lymphatic: No pathologically enlarged lymph nodes. No abdominal aortic aneurysm. Aortic atherosclerotic calcification noted. Reproductive:  Mildly enlarged prostate gland again noted. Other:  None. Musculoskeletal:  No suspicious bone lesions identified. IMPRESSION: New airspace disease in peripheral left lower lobe with tiny left pleural effusion,  suspicious for pneumonia. No abscess or other acute findings within the abdomen or pelvis. Tiny nonobstructing left renal and urinary bladder calculi. No evidence of ureteral calculi or hydronephrosis. Mildly enlarged prostate and findings of chronic bladder outlet obstruction. Aortic Atherosclerosis (ICD10-I70.0) and Emphysema (ICD10-J43.9). Electronically Signed   By: Danae Orleans M.D.   On: 08/11/2020 04:50      Assessment/Plan Principal Problem:   Sepsis (HCC) Active Problems:   HTN (hypertension)   Diabetes mellitus type 2, uncomplicated (HCC)   Pulmonary HTN (HCC)   Acute CVA (cerebrovascular accident) (HCC)    1. Possible CVA -CT of the head shows concerning features for acute versus subacute CVA.  Discussed with on-call neurologist Dr. Otelia Limes who advised to get MRI brain.  If MRI brain is positive then consult neurology for further work-up of stroke. 2. Sepsis -patient symptoms  are concerning for sepsis with hypotension elevated lactic acid and chest x-ray showing infiltrates with acute encephalopathy.  Started on empiric antibiotics follow cultures continue hydration blood pressure is improved with fluids per oral antihypertensives.  Follow lactic acid levels procalcitonin levels. 3. History of diabetes mellitus type 2 we will keep patient on sliding scale coverage for now. 4. Hypertension -hold antihypertensives in the setting of possible sepsis with presentation of hypotension and also possible CVA. 5. History of COPD not actively wheezing. 6. Anemia appears to be chronic.  Covid test, EKG MRI brain all are pending at this time.  Patient has been kept n.p.o. and speech therapy consult has been requested.   DVT prophylaxis: Lovenox. Code Status: DNR confirmed with patient's son. Family Communication: Patient's son. Disposition Plan: To be determined. Consults called: Discussed with neurology. Admission status: Inpatient.   Eduard Clos MD [facility] Pager [phone].  If 7PM-7AM, please contact night-coverage [email or web address] Password [facility]  08/11/2020, 5:36 AM

## 2020-08-11 NOTE — ED Notes (Signed)
Pt transported to CT ?

## 2020-08-11 NOTE — Progress Notes (Signed)
Code Sepsis initiated @ 0308 AM, ELINK following.

## 2020-08-11 NOTE — ED Notes (Signed)
Patient's heart rate noted 130-150s afib rvr. EKG performed and verified. Page placed to Dr. Gerri Lins. Awaiting call back. Patient denies complaints. Afebrile.

## 2020-08-11 NOTE — ED Notes (Signed)
COVID positive test results paged to Dr. Gerri Lins

## 2020-08-11 NOTE — ED Provider Notes (Signed)
[place] EMERGENCY DEPARTMENT Provider Note  CSN: [id number] Arrival date & time: 08/06/2020 2005    History Chief Complaint  Patient presents with  . Altered Mental Status    HPI  Alejandro Hall is a 86 y.o. male with multiple medical problems brought to the ED by son for evaluation of AMS. Son provides history and reports at baseline the patient lives with him and is relatively independent, walks, converses, uses the restroom. He has some confusion from time to time but in the last 2 days he has had a significant change in mental status. No longer talking, unable to get out of bed. Has not been eating or drinking. No known fevers at home. He had not been complaining of chest pain, did not have any SOB above baseline COPD. No vomiting. He had some urinary incontinence at home and was complaining of some abdominal pain. Diarrhea today. Non bloody. Son called PCP who was concerned about UTI and advised him to come to the ED for evaluation.    Past Medical History:  Diagnosis Date  . Arthritis   . COPD (chronic obstructive pulmonary disease) (HCC)   . Diabetes mellitus without complication (HCC)   . History of kidney stones   . Hypertension   . Seizures (HCC)   . Shortness of breath dyspnea     Past Surgical History:  Procedure Laterality Date  . APPENDECTOMY    . HERNIA REPAIR    . MELANOMA EXCISION     facial moles  . TONSILLECTOMY      Family History  Problem Relation Age of Onset  . Diabetes Mellitus II Neg Hx   . Hypertension Neg Hx     Social History   Tobacco Use  . Smoking status: Former Games developermoker  . Smokeless tobacco: Never Used  . Tobacco comment: quit smoking in 1982  Substance Use Topics  . Alcohol use: No  . Drug use: No     Home Medications Prior to Admission medications   Medication Sig Start Date End Date Taking? Authorizing Provider  acetaminophen (TYLENOL) 500 MG tablet Take 1,500 mg by mouth at bedtime.    [provider]  albuterol  (PROVENTIL HFA;VENTOLIN HFA) 108 (90 BASE) MCG/ACT inhaler Inhale 2 puffs into the lungs every 6 (six) hours as needed for wheezing or shortness of breath. 08/17/14   Penny PiaVega, [place], MD  amLODipine (NORVASC) 10 MG tablet Take 1 tablet (10 mg total) by mouth daily. 08/18/14   Penny PiaVega, [place], MD  aspirin EC 81 MG EC tablet Take 1 tablet (81 mg total) by mouth daily. 08/17/14   Penny PiaVega, [place], MD  atorvastatin (LIPITOR) 10 MG tablet Take 10 mg by mouth daily. 08/09/15   [provider]  azithromycin (ZITHROMAX) 250 MG tablet Take 1 tablet (250 mg total) by mouth daily. Take 1 every day until finished. 01/27/16   Mesner, Barbara CowerJason, MD  glimepiride (AMARYL) 2 MG tablet Take 2 mg by mouth daily with breakfast.  01/17/16   [provider]  glucose blood test strip Use as instructed 10/26/15   Christiane HaSullivan, Corinna L, MD  metFORMIN (GLUCOPHAGE) 1000 MG tablet Take 1,000 mg by mouth 2 (two) times daily. 01/17/16   [provider]  predniSONE (DELTASONE) 20 MG tablet 2 tabs po daily x 4 days 01/27/16   Mesner, Barbara CowerJason, MD  umeclidinium-vilanterol (ANORO ELLIPTA) 62.5-25 MCG/INH AEPB Inhale 1 puff into the lungs daily.    [provider]     Allergies  Patient has no known allergies.   Review of Systems   Review of Systems Unable to assess due to mental status.    Physical Exam BP 136/71   Pulse 94   Temp 99 F (37.2 C) (Oral)   Resp (!) 27   Ht 5\' 8"  (1.727 m)   Wt 54.4 kg   SpO2 100%   BMI 18.25 kg/m   Physical Exam Vitals and nursing note reviewed.  Constitutional:      Comments: thin  HENT:     Head: Normocephalic and atraumatic.     Nose: Nose normal.     Mouth/Throat:     Mouth: Mucous membranes are dry.  Eyes:     Extraocular Movements: Extraocular movements intact.     Conjunctiva/sclera: Conjunctivae normal.  Cardiovascular:     Rate and Rhythm: Normal rate.  Pulmonary:     Effort: Pulmonary effort is normal.     Breath sounds: Wheezing present.   Abdominal:     General: Abdomen is flat. There is no distension.     Palpations: Abdomen is soft.     Tenderness: There is abdominal tenderness (diffuse). There is no guarding.  Musculoskeletal:        General: No swelling. Normal range of motion.     Cervical back: Neck supple.  Skin:    General: Skin is warm and dry.  Neurological:     Comments: Confused, does not answer questions, in bed in fetal position  Psychiatric:     Comments: Unable to assess      ED Results / Procedures / Treatments   Labs (all labs ordered are listed, but only abnormal results are displayed) Labs Reviewed  COMPREHENSIVE METABOLIC PANEL - Abnormal; Notable for the following components:      Result Value   Chloride 88 (*)    BUN 27 (*)    Creatinine, Ser 1.26 (*)    Calcium 8.6 (*)    Albumin 2.9 (*)    AST 56 (*)    GFR, Estimated 55 (*)    Anion gap 22 (*)    All other components within normal limits  LACTIC ACID, PLASMA - Abnormal; Notable for the following components:   Lactic Acid, Venous 3.1 (*)    All other components within normal limits  CBC WITH DIFFERENTIAL/PLATELET - Abnormal; Notable for the following components:   Hemoglobin 12.3 (*)    Neutro Abs 9.2 (*)    All other components within normal limits  URINALYSIS, ROUTINE W REFLEX MICROSCOPIC - Abnormal; Notable for the following components:   APPearance HAZY (*)    Hgb urine dipstick MODERATE (*)    Ketones, ur 5 (*)    Protein, ur 30 (*)    Bacteria, UA RARE (*)    All other components within normal limits  CULTURE, BLOOD (ROUTINE X 2)  CULTURE, BLOOD (ROUTINE X 2)  RESP PANEL BY RT-PCR (FLU A&B, COVID) ARPGX2  PROTIME-INR  LACTIC ACID, PLASMA    EKG None  Radiology DG Chest 2 View  Result Date: 08/17/2020 CLINICAL DATA:  Sepsis altered EXAM: CHEST - 2 VIEW COMPARISON:  01/26/2016 FINDINGS: Hyperinflation. Diffuse airspace disease in the left thorax suspicious for acute pneumonia. Stable cardiomediastinal  silhouette with aortic atherosclerosis. No pneumothorax IMPRESSION: Diffuse airspace disease in the left thorax suspicious for acute pneumonia. Electronically Signed   By: 03/27/2016 M.D.   On: 08/14/2020 21:02   CT Head Wo Contrast  Result Date: 08/11/2020 CLINICAL DATA:  Encephalopathy EXAM:  CT HEAD WITHOUT CONTRAST TECHNIQUE: Contiguous axial images were obtained from the base of the skull through the vertex without intravenous contrast. COMPARISON:  None. FINDINGS: Brain: There is hypoattenuation within the left brainstem. Diffuse generalized atrophy with periventricular white matter hypoattenuation. No acute hemorrhage. Vascular: Atherosclerotic calcification of the internal carotid arteries at the skull base. No abnormal hyperdensity of the major intracranial arteries or dural venous sinuses. Skull: The visualized skull base, calvarium and extracranial soft tissues are normal. Sinuses/Orbits: Partial opacification of the left maxillary sinus. The orbits are normal. IMPRESSION: 1. Hypoattenuation within the left brainstem, consistent with acute or subacute infarct. 2. No acute hemorrhage or mass effect. Electronically Signed   By: Deatra Robinson M.D.   On: 08/11/2020 04:49   CT Abdomen Pelvis W Contrast  Result Date: 08/11/2020 CLINICAL DATA:  Sepsis.  Suspected abdominal infection or abscess. EXAM: CT ABDOMEN AND PELVIS WITH CONTRAST TECHNIQUE: Multidetector CT imaging of the abdomen and pelvis was performed using the standard protocol following bolus administration of intravenous contrast. CONTRAST:  OMNIPAQUE IOHEXOL 300 MG/ML  SOLN COMPARISON:  Noncontrast CT on 09/23/2010 FINDINGS: Lower Chest: New airspace disease is seen in the peripheral left lower lobe with tiny left pleural effusion. Emphysema also noted. Hepatobiliary: Probable tiny hepatic cysts noted. Postop changes from previous right hepatectomy and cholecystectomy. No definite liver masses identified. No evidence of biliary  ductal dilatation. Pancreas:  No mass or inflammatory changes. Spleen: Within normal limits in size and appearance. Adrenals/Urinary Tract: Stable nodularity of left adrenal gland. A few tiny renal cysts are seen bilaterally. No masses identified. A few tiny 2-3 mm calculi are noted in the left kidney. No evidence of ureteral calculi or hydronephrosis. A few tiny 1-2 mm calculi are also seen in the urinary bladder. Diffuse bladder wall thickening is seen, likely due to chronic bladder outlet obstruction given enlarged prostate. Stomach/Bowel: No evidence of obstruction, inflammatory process or abnormal fluid collections. Vascular/Lymphatic: No pathologically enlarged lymph nodes. No abdominal aortic aneurysm. Aortic atherosclerotic calcification noted. Reproductive:  Mildly enlarged prostate gland again noted. Other:  None. Musculoskeletal:  No suspicious bone lesions identified. IMPRESSION: New airspace disease in peripheral left lower lobe with tiny left pleural effusion, suspicious for pneumonia. No abscess or other acute findings within the abdomen or pelvis. Tiny nonobstructing left renal and urinary bladder calculi. No evidence of ureteral calculi or hydronephrosis. Mildly enlarged prostate and findings of chronic bladder outlet obstruction. Aortic Atherosclerosis (ICD10-I70.0) and Emphysema (ICD10-J43.9). Electronically Signed   By: Danae Orleans M.D.   On: 08/11/2020 04:50    Procedures Procedures  Medications Ordered in the ED Medications  lactated ringers infusion ( Intravenous New Bag/Given 08/11/20 0355)  lactated ringers bolus 1,000 mL (0 mLs Intravenous Stopped 08/11/20 0416)    And  lactated ringers bolus 1,000 mL (0 mLs Intravenous Stopped 08/11/20 0445)  ceFEPIme (MAXIPIME) 2 g in sodium chloride 0.9 % 100 mL IVPB (0 g Intravenous Stopped 08/11/20 0416)  vancomycin (VANCOCIN) IVPB 1000 mg/200 mL premix (0 mg Intravenous Stopped 08/11/20 0445)  iohexol (OMNIPAQUE) 300 MG/ML solution 100  mL (100 mLs Intravenous Contrast Given 08/11/20 0417)     MDM Rules/Calculators/A&P MDM Patient with AMS, unclear etiology. Initial vitals in triage with mild tachycardia but afebrile with normal BP. Patient had a prolonged ED wait time due to overcrowding. Labs done in triage reviewed, no leukocytosis but there is an elevated lactic acid of unclear significance. There is concern for UTI or other intraabdominal process as well as signs  of PNA on CXR. Will begin sepsis treatment now he is in an exam room, LR bolus and broad spectrum Abx based on estimated weight (his prior weight is from 4 years ago and he is not able to stand for measurement today).    ED Course  I have reviewed the triage vital signs and the nursing notes.  Pertinent labs & imaging results that were available during my care of the patient were reviewed by me and considered in my medical decision making (see chart for details).  Clinical Course as of 08/11/20 0513  Sat Aug 11, 2020  0348 While doing peri-care, RN noticed some skin breakdown on scrotum, appears to be likely fungal, no signs of significant bacterial superinfection.  [CS]  0452 CT head with signs of acute/subacute brain stem infarct. UA does not show convincing signs of infection. PNA on CXR may be aspiration given AMS and signs of brain stem stroke. Will discuss with Hospitalist for admission.  [CS]  [phone] Spoke with son regarding our findings today including brain stem stroke and PNA. He advises that patient would want to be DNR. Spoke with Dr. Toniann Fail, Hospitalist, who will evaluate for admission.  [CS]    Clinical Course User Index [CS] Pollyann Savoy, MD    Final Clinical Impression(s) / ED Diagnoses Final diagnoses:  Cerebrovascular accident (CVA) due to embolism of precerebral artery (HCC)  Community acquired pneumonia of left lower lobe of lung  Somnolence    Rx / DC Orders ED Discharge Orders    None       Pollyann Savoy,  MD 08/11/20 [phone]

## 2020-08-11 NOTE — Progress Notes (Signed)
Sent secure chat to ask ED  RN to send a second LA after fluids, RN responded that PT is in CT and will do once fluids have ended and pt is back from CT.

## 2020-08-11 NOTE — Progress Notes (Signed)
Clinical/Bedside Swallow Evaluation Patient Details  Name: Alejandro Hall MRN: [id number] Date of Birth: 1929/01/16  Today's Date: 08/11/2020 Time: SLP Start Time (ACUTE ONLY): 0950 SLP Stop Time (ACUTE ONLY): 1025 SLP Time Calculation (min) (ACUTE ONLY): 35 min  Past Medical History:  Past Medical History:  Diagnosis Date   Arthritis    COPD (chronic obstructive pulmonary disease) (HCC)    Diabetes mellitus without complication (HCC)    History of kidney stones    Hypertension    Seizures (HCC)    Shortness of breath dyspnea    Past Surgical History:  Past Surgical History:  Procedure Laterality Date   APPENDECTOMY     HERNIA REPAIR     MELANOMA EXCISION     facial moles   TONSILLECTOMY     HPI:  pt is an 86 yo male adm to [facility] ED with respiratory deficits.  Pt PMH + for DM2,HTN, COPD, ? left sided weakness and acute vs subacute CVA.  Swallow eval ordered.  Pt also with chronic bladder obstruction.   Assessment / Plan / Recommendation Clinical Impression  Patient presents with clinical indications of severely impaired airway protection of event secretion. His baseline cough is weak and congested.    No focal CN deficits noted however pt appears kyphotic and weak and did not consistently follow directions.  With oral moisture from toothette, pt with increased wet breathing quality, increased RR with accesory muscle use and appearance of discomfort *despite his denial.  Pt does attempt to cough with cues, he did not expectorate and swallowed x2.  At one point during the exam, pt stared ahead and did not follow any directions for approximately 1 minute, which would limit his ability to have po regardless of respiratory status.     Pt's HR became elevated as well as RR and did not significantly decline during testing.  Did not provide other intake due to increased risks for aspiration on this very deconditioned and fragile pt.    Would recommend only oral moisture for  comfort only and encourage pt to cough and expectorate secretions as able.   Dr Gerri Lins and RN informed of recommendations.  SLP Visit Diagnosis: Dysphagia, oropharyngeal phase (R13.12)    Aspiration Risk  Severe aspiration risk;Risk for inadequate nutrition/hydration    Diet Recommendation NPO (oral moisture)        Other  Recommendations Recommended Consults:  (palliative)   Follow up Recommendations        Frequency and Duration            Prognosis Prognosis for Safe Diet Advancement: Guarded Barriers to Reach Goals: Severity of deficits;Other (Comment);Cognitive deficits (respiratory status)      Swallow Study   General Date of Onset: 08/11/20 HPI: pt is an 86 yo male adm to [facility] ED with respiratory deficits.  Pt PMH + for DM2,HTN, COPD, ? left sided weakness and acute vs subacute CVA.  Swallow eval ordered.  Pt also with chronic bladder obstruction. Type of Study: Bedside Swallow Evaluation Previous Swallow Assessment: none Diet Prior to this Study: NPO Temperature Spikes Noted: No Respiratory Status: Nasal cannula (? t) History of Recent Intubation: No Behavior/Cognition: Alert;Confused Oral Cavity Assessment: Within Functional Limits Oral Care Completed by SLP: No Oral Cavity - Dentition: Edentulous Vision: Functional for self-feeding Self-Feeding Abilities: Needs assist Patient Positioning: Upright in bed Baseline Vocal Quality: Low vocal intensity Volitional Cough: Weak Volitional Swallow: Unable to elicit    Oral/Motor/Sensory Function Overall Oral Motor/Sensory Function: Generalized oral weakness (  pt did not follow directions for oral motor exam, able to seal lips on toothette)   Ice Chips Ice chips: Not tested   Thin Liquid Thin Liquid: Impaired Presentation: Spoon (oral moisture via toothette) Pharyngeal  Phase Impairments: Suspected delayed Swallow;Wet Vocal Quality;Change in Vital Signs;Decreased hyoid-laryngeal movement;Throat Clearing - Immediate     Nectar Thick Nectar Thick Liquid: Not tested   Honey Thick Honey Thick Liquid: Not tested   Puree Puree: Not tested   Solid     Solid: Not tested      Chales Abrahams 08/11/2020,10:53 AM   Rolena Infante, MS [facility] SLP Acute Rehab Services Office [phone] Pager [phone]

## 2020-08-12 LAB — CBC WITH DIFFERENTIAL/PLATELET
Abs Immature Granulocytes: 0 10*3/uL (ref 0.00–0.07)
Basophils Absolute: 0 10*3/uL (ref 0.0–0.1)
Basophils Relative: 0 %
Eosinophils Absolute: 0 10*3/uL (ref 0.0–0.5)
Eosinophils Relative: 0 %
HCT: 35.7 % — ABNORMAL LOW (ref 39.0–52.0)
Hemoglobin: 10.9 g/dL — ABNORMAL LOW (ref 13.0–17.0)
Lymphocytes Relative: 0 %
Lymphs Abs: 0 10*3/uL — ABNORMAL LOW (ref 0.7–4.0)
MCH: 27.4 pg (ref 26.0–34.0)
MCHC: 30.5 g/dL (ref 30.0–36.0)
MCV: 89.7 fL (ref 80.0–100.0)
Monocytes Absolute: 0 10*3/uL — ABNORMAL LOW (ref 0.1–1.0)
Monocytes Relative: 0 %
Neutro Abs: 20.4 10*3/uL — ABNORMAL HIGH (ref 1.7–7.7)
Neutrophils Relative %: 100 %
Platelets: 255 10*3/uL (ref 150–400)
RBC: 3.98 MIL/uL — ABNORMAL LOW (ref 4.22–5.81)
RDW: 14.7 % (ref 11.5–15.5)
WBC: 20.4 10*3/uL — ABNORMAL HIGH (ref 4.0–10.5)
nRBC: 0 % (ref 0.0–0.2)
nRBC: 0 /100 WBC

## 2020-08-12 LAB — CBG MONITORING, ED
Glucose-Capillary: 113 mg/dL — ABNORMAL HIGH (ref 70–99)
Glucose-Capillary: 133 mg/dL — ABNORMAL HIGH (ref 70–99)
Glucose-Capillary: 142 mg/dL — ABNORMAL HIGH (ref 70–99)

## 2020-08-12 LAB — GLUCOSE, CAPILLARY
Glucose-Capillary: 152 mg/dL — ABNORMAL HIGH (ref 70–99)
Glucose-Capillary: 120 mg/dL — ABNORMAL HIGH (ref 70–99)

## 2020-08-12 LAB — COMPREHENSIVE METABOLIC PANEL
ALT: 21 U/L (ref 0–44)
AST: 52 U/L — ABNORMAL HIGH (ref 15–41)
Albumin: 1.9 g/dL — ABNORMAL LOW (ref 3.5–5.0)
Alkaline Phosphatase: 65 U/L (ref 38–126)
Anion gap: 13 (ref 5–15)
BUN: 25 mg/dL — ABNORMAL HIGH (ref 8–23)
CO2: 34 mmol/L — ABNORMAL HIGH (ref 22–32)
Calcium: 7.8 mg/dL — ABNORMAL LOW (ref 8.9–10.3)
Chloride: 97 mmol/L — ABNORMAL LOW (ref 98–111)
Creatinine, Ser: 1.06 mg/dL (ref 0.61–1.24)
GFR, Estimated: >60 mL/min (ref >60)
Glucose, Bld: 191 mg/dL — ABNORMAL HIGH (ref 70–99)
Potassium: 3.1 mmol/L — ABNORMAL LOW (ref 3.5–5.1)
Sodium: 144 mmol/L (ref 135–145)
Total Bilirubin: 0.4 mg/dL (ref 0.3–1.2)
Total Protein: 5.2 g/dL — ABNORMAL LOW (ref 6.5–8.1)

## 2020-08-12 LAB — C-REACTIVE PROTEIN: CRP: 25.3 mg/dL — ABNORMAL HIGH (ref <1.0)

## 2020-08-12 LAB — D-DIMER, QUANTITATIVE: D-Dimer, Quant: 1.84 ug/mL-FEU — ABNORMAL HIGH (ref 0.00–0.50)

## 2020-08-12 LAB — FERRITIN: Ferritin: 650 ng/mL — ABNORMAL HIGH (ref 24–336)

## 2020-08-12 MED ORDER — DEXTROSE-NACL 5-0.45 % IV SOLN: INTRAVENOUS | Status: DC

## 2020-08-12 MED ORDER — POTASSIUM CHLORIDE 10 MEQ/100ML IV SOLN
10.0000 meq | INTRAVENOUS | Status: AC
  Administered 2020-08-12 (×2): 10 meq via INTRAVENOUS
  Filled 2020-08-12 (×3): qty 100

## 2020-08-12 NOTE — ED Notes (Signed)
Upon assessment, patient found to be moaning. Pt denies pain though. Resp even and unlabored. Skin warm and dry. Male Purewick in place with amber, concentrated urine in the canister. Call bell within reach. Bed in low position.

## 2020-08-12 NOTE — Progress Notes (Signed)
  Speech Language Pathology Treatment: Dysphagia  Patient Details Name: Alejandro Hall MRN: [id number] DOB: 02/03/1929 Today's Date: 08/12/2020 Time: 1202-1217 SLP Time Calculation (min) (ACUTE ONLY): 15 min  Assessment / Plan / Recommendation Clinical Impression  Today, pt was able to follow simple commands (improved from yesterday). He focused attention to therapist/task when asked but fairly easily distracted. He continues to be tachypneic and RR up to 33 but breathing did not appear labored. Volitional cough may be slightly stronger than on evaluation but congestion persists. Applesauce consistency was propelled orally with mild delays via observation measure only. + Cough present with thin water with straw and discoordinated swallow and respiratory pattern with teaspoon sips. Recommended RN crush pills in puree only and continue NPO (all meds are currently IV). ST will follow for most appropriate course of treatment for pt to initiate conservative diet/liquids, instrumental swallow eval or liberalize po depending on goals of care.    HPI HPI: pt is an 86 yo male adm to [facility] ED with respiratory deficits.  Pt PMH + for DM2,HTN, COPD, ? left sided weakness and acute vs subacute CVA.  Swallow eval ordered.  Pt also with chronic bladder obstruction.      SLP Plan  Continue with current plan of care       Recommendations  Diet recommendations: NPO Medication Administration: Crushed with puree                Oral Care Recommendations: Oral care QID Follow up Recommendations:  (TBD) SLP Visit Diagnosis: Dysphagia, unspecified (R13.10) Plan: Continue with current plan of care       GO                Royce Macadamia 08/12/2020, 12:39 PM  Breck Coons Lonell Face.Ed Nurse, children's [phone] Office [phone]

## 2020-08-12 NOTE — ED Notes (Signed)
Patient resting on stretcher in poc. Noted patient in and out of afib to nsr throughout shift but rate has been controlled since received prn metoprolol x1. NAD at this time. Monitoring remains. Oxygen remains. Will continue to monitor.

## 2020-08-12 NOTE — Progress Notes (Signed)
PROGRESS NOTE  Alejandro Hall TKP:[id number] DOB: Aug 28, 1928 DOA: Aug 24, 2020 PCP: Alejandro Funk, MD  Brief History     The patient is a 86 yr old man who lives at home with his son, Alejandro Hall. He carries a past medical history significant for arthritis, COPD, DM II, hypertension, seizure disorder, and history of kidney stones.  The patient was brought to [facility] on 24-Aug-2020 by his son for increasing confusion, inability to ambulate on the advise of the patient's PCP.   In the ED the patient was oriented to name only. He was found to be hypoxic and was able to move all extremities. The patient was discussed with Alejandro Hall.  Although CT head was suggestive of acute vs subacute stroke, MRI brain was negative for CVA. Alejandro Hall did not feel that the patient's presentation was consistent with CVA. CXR demonstrated diffuse airspace disease in the left thorax suspicious for acute pneumonia. The patient was started on IV cefepime and azithromycin. COVID 19 testing came back positive.  The patient has been evaluated by SLP, and he has been found incapable of managing his own secretions. He is now NPO. Blood sugars are supported with D10.  The patient has been placed on the COVID-19 protocol. His respiratory and mental status have declined since presentation. He is DNR, and with poor prognosis, palliative medicine has been consulted .   Consultants  . Neurology . Palliative  Procedures  . None  Antibiotics   Anti-infectives (From admission, onward)   Start     Dose/Rate Route Frequency Ordered Stop   08/12/20 1000  remdesivir 100 mg in sodium chloride 0.9 % 100 mL IVPB       "Followed by" Linked Group Details   100 mg 200 mL/hr over 30 Minutes Intravenous Daily 08/11/20 0900 08/15/2020 0959   08/11/20 2200  cefTRIAXone (ROCEPHIN) 2 g in sodium chloride 0.9 % 100 mL IVPB        2 g 200 mL/hr over 30 Minutes Intravenous Every 24 hours 08/11/20 0536     08/11/20 0845  remdesivir 200 mg in sodium  chloride 0.9% 250 mL IVPB       "Followed by" Linked Group Details   200 mg 580 mL/hr over 30 Minutes Intravenous Once 08/11/20 0900 08/11/20 1123   08/11/20 0800  azithromycin (ZITHROMAX) 500 mg in sodium chloride 0.9 % 250 mL IVPB        500 mg 250 mL/hr over 60 Minutes Intravenous Every 24 hours 08/11/20 0536     08/11/20 0315  ceFEPIme (MAXIPIME) 2 g in sodium chloride 0.9 % 100 mL IVPB        2 g 200 mL/hr over 30 Minutes Intravenous  Once 08/11/20 0310 08/11/20 0416   08/11/20 0315  vancomycin (VANCOCIN) IVPB 1000 mg/200 mL premix        1,000 mg 200 mL/hr over 60 Minutes Intravenous  Once 08/11/20 0310 08/11/20 0445   08/11/20 0315  piperacillin-tazobactam (ZOSYN) IVPB 4.5 g  Status:  Discontinued        4.5 g 200 mL/hr over 30 Minutes Intravenous  Once 08/11/20 0310 08/11/20 0316     Subjective  The patient is moribund. He is minimally responsive to stimulation. He is moving all extremities.   Objective   Vitals:  Vitals:   08/12/20 1200 08/12/20 1400  BP: 131/76 129/83  Pulse: (!) 109 79  Resp: 17 (!) 27  Temp:    SpO2: 98% 99%   Exam:  Constitutional:  The patient  is moribund, minimally responsive.  Marland Kitchen  Respiratory:  . Positive for increased work of breathing. Marland Kitchen Positive for diffuse rales left greater than right. . No tactile fremitus Cardiovascular:  . Regular rate and rhythm, tachycardic . No murmurs, ectopy, or gallups. . No lateral PMI. No thrills. Abdomen:  . Abdomen is soft, non-tender, non-distended . Scaffoid . No hernias, masses, or organomegaly . Normoactive bowel sounds.  Musculoskeletal:  . No cyanosis, clubbing, or edema Skin:  . There are stage II decubitus ulcers on the patient's ischiums bilaterally and shoulders bilaterally. . No edema, no rashes. . palpation of skin: no induration or nodules Neurologic:  . Unable to evaluate due to the patient's inability to cooperate with exam. Psychiatric:  Unable to evaluate due to the patient's  inability to cooperate with exam.  I have personally reviewed the following:   Today's Data  . Vitals, CMP, CBC, Procalcitonin, Lactic Acid, Ferritin, CRP, D Dimer, LDH, Troponin  Micro Data  . Blood cultures x 2: No growth . COVID-19: Positive  Imaging  . CXR . CT abdomen and pelvis . CT head . MRI Brain  Cardiology Data  . EKG  Scheduled Meds: . albuterol  2 puff Inhalation Q6H  . aspirin  300 mg Rectal Daily  . dexamethasone (DECADRON) injection  6 mg Intravenous Q24H  . enoxaparin (LOVENOX) injection  40 mg Subcutaneous Daily  . insulin aspart  0-20 Units Subcutaneous TID WC  . insulin aspart  0-9 Units Subcutaneous Q4H  . insulin aspart  4 Units Subcutaneous TID WC  . insulin detemir  0.15 Units/kg Subcutaneous BID   Continuous Infusions: . azithromycin Stopped (08/12/20 1207)  . cefTRIAXone (ROCEPHIN)  IV Stopped (08/11/20 2158)  . dextrose 30 mL/hr at 08/11/20 1159  . lactated ringers 100 mL/hr at 08/12/20 1125  . remdesivir 100 mg in NS 100 mL Stopped (08/12/20 1312)    Principal Problem:   Sepsis (HCC) Active Problems:   HTN (hypertension)   Diabetes mellitus type 2, uncomplicated (HCC)   Pulmonary HTN (HCC)   Acute CVA (cerebrovascular accident) (HCC)   Severe sepsis (HCC)   LOS: 1 day   A & P    Severe Sepsis, present on admission: -  Tachycardia, tachypnea, hypotension, altered mental status, elevated creatinine, elevated WBC, in the setting of bacterial and covid pneumonia. -Proalcitonin is elevated at 2.32, continue with IV antibiotics for bacterial pneumonia -1/2 blood cultures positive for gram-positive cocci in chains, this is likely contaminant, will continue to monitor.  COVID pneumonia:  - The patient is receiving remdesivir, IV steroids, supplemental O2, vitamin C, zinc, nebulizer treatments. Supplemental O2. Given increased procalcitonin will continue IV antibiotics for now. His respiratory and mental status have declined since  presentation. He is DNR, but I have discussed the patient with his son, Alejandro Hall, and told him that I felt that comfort care would be appropriate if the patient continues to decline as he is has very little reserve on which to rely on in the face of this overwhelming illness.  Acute on chronic hypoxic respiratory failure;  -She is on 3 L oxygen at home, initially requiring up to 6 L, this has improved, currently back to baseline .  DM II:  - Hb A1c was 5.9.  -Now passes swallow evaluation, remains n.p.o., will DC sliding scale and keep on fingersticks, will start on insulin have a started to increase.    Severe protein calorie malnutrtion: Evident upon presentation. Pt is now NPO due to his severe  dysphagia. Will consult nutrition for their on advise on how to best support this severely malnurished elderly man.  Acute encephalopathy -No evidence of stroke on MRI, encephalopathy most likely in the setting of sepsis, and COVID-19 encephalopathy.  Pulmonary Hypertension: Noted. Complicates care of respiratory status and hypoxia.  Goals of care: -palliative  medicine input greatly appreciated. -Discussed with son again about goals of care, CODE STATUS remains the same, discussed goals of care, at this point decision has been made to monitor on current measures including antibiotics, fluids and medical management with no escalation of care over the next 24 hours, and will reassess comfort care decision with son tomorrow pending clinical progression.  DVT Prophylaxis: Lovenox CODE STATUS: DNR.  Family Communication: I have discussed the patient with Alejandro Hall, the patient's son.  Disposition: Status is: Inpatient  Remains inpatient appropriate because:Inpatient level of care appropriate due to severity of illness   Dispo: The patient is from: Home              Anticipated d/c is to: TBD              Anticipated d/c date is: 3 days              Patient currently is not medically stable to d/c.    Huey Bienenstock MD [facility] Direct contact: see [email or web address]  7PM-7AM contact night coverage as above

## 2020-08-12 NOTE — Progress Notes (Signed)
Brief Palliative Care Progress Note:  Chart review performed. Spoke with Dr. Randol Kern - stated he spoke with the patient's son and the plan is to provide another 24 hours of watchful waiting. PMT will continue to follow for decline today and will plan to continue GOC conversation with son/[name] tomorrow 12/19.  Thank you for allowing PMT to assist in the care of this patient.  Winona Sison M. Katrinka Blazing [facility] Palliative Medicine Team Team Phone: [phone] NO CHARGE

## 2020-08-12 NOTE — Progress Notes (Signed)
Pt arrived on unit with no belongings. AOx1.  IVFs infusing. Skin noted in LDA/flowsheets. Call bell within reach. Bed alarm on. Will continue to monitor.

## 2020-08-12 NOTE — ED Notes (Signed)
Provider at bedside and aware that pt did not get insulin because pt has not been alert enough for PO intake

## 2020-08-12 NOTE — ED Notes (Signed)
Attempted to call report nurse not available

## 2020-08-13 ENCOUNTER — Encounter (HOSPITAL_COMMUNITY): Payer: Self-pay | Admitting: Internal Medicine

## 2020-08-13 DIAGNOSIS — L8990 Pressure ulcer of unspecified site, unspecified stage: Secondary | ICD-10-CM | POA: Insufficient documentation

## 2020-08-13 DIAGNOSIS — Z515 Encounter for palliative care: Secondary | ICD-10-CM | POA: Diagnosis not present

## 2020-08-13 DIAGNOSIS — Z66 Do not resuscitate: Secondary | ICD-10-CM | POA: Diagnosis not present

## 2020-08-13 DIAGNOSIS — U071 COVID-19: Secondary | ICD-10-CM | POA: Diagnosis not present

## 2020-08-13 DIAGNOSIS — A4189 Other specified sepsis: Secondary | ICD-10-CM | POA: Diagnosis not present

## 2020-08-13 LAB — CBC WITH DIFFERENTIAL/PLATELET
Abs Immature Granulocytes: 0.14 10*3/uL — ABNORMAL HIGH (ref 0.00–0.07)
Basophils Absolute: 0 10*3/uL (ref 0.0–0.1)
Basophils Relative: 0 %
Eosinophils Absolute: 0 10*3/uL (ref 0.0–0.5)
Eosinophils Relative: 0 %
HCT: 36.4 % — ABNORMAL LOW (ref 39.0–52.0)
Hemoglobin: 11.3 g/dL — ABNORMAL LOW (ref 13.0–17.0)
Immature Granulocytes: 1 %
Lymphocytes Relative: 3 %
Lymphs Abs: 0.5 10*3/uL — ABNORMAL LOW (ref 0.7–4.0)
MCH: 27.4 pg (ref 26.0–34.0)
MCHC: 31 g/dL (ref 30.0–36.0)
MCV: 88.3 fL (ref 80.0–100.0)
Monocytes Absolute: 0.7 10*3/uL (ref 0.1–1.0)
Monocytes Relative: 3 %
Neutro Abs: 19.4 10*3/uL — ABNORMAL HIGH (ref 1.7–7.7)
Neutrophils Relative %: 93 %
Platelets: 291 10*3/uL (ref 150–400)
RBC: 4.12 MIL/uL — ABNORMAL LOW (ref 4.22–5.81)
RDW: 14.6 % (ref 11.5–15.5)
WBC: 20.8 10*3/uL — ABNORMAL HIGH (ref 4.0–10.5)
nRBC: 0 % (ref 0.0–0.2)

## 2020-08-13 LAB — C-REACTIVE PROTEIN: CRP: 27 mg/dL — ABNORMAL HIGH (ref <1.0)

## 2020-08-13 LAB — GLUCOSE, CAPILLARY
Glucose-Capillary: 105 mg/dL — ABNORMAL HIGH (ref 70–99)
Glucose-Capillary: 157 mg/dL — ABNORMAL HIGH (ref 70–99)
Glucose-Capillary: 175 mg/dL — ABNORMAL HIGH (ref 70–99)
Glucose-Capillary: 48 mg/dL — ABNORMAL LOW (ref 70–99)
Glucose-Capillary: 48 mg/dL — ABNORMAL LOW (ref 70–99)
Glucose-Capillary: 48 mg/dL — ABNORMAL LOW (ref 70–99)
Glucose-Capillary: 74 mg/dL (ref 70–99)

## 2020-08-13 LAB — COMPREHENSIVE METABOLIC PANEL
ALT: 26 U/L (ref 0–44)
AST: 56 U/L — ABNORMAL HIGH (ref 15–41)
Albumin: 1.7 g/dL — ABNORMAL LOW (ref 3.5–5.0)
Alkaline Phosphatase: 62 U/L (ref 38–126)
Anion gap: 8 (ref 5–15)
BUN: 22 mg/dL (ref 8–23)
CO2: 38 mmol/L — ABNORMAL HIGH (ref 22–32)
Calcium: 7.8 mg/dL — ABNORMAL LOW (ref 8.9–10.3)
Chloride: 98 mmol/L (ref 98–111)
Creatinine, Ser: 0.86 mg/dL (ref 0.61–1.24)
GFR, Estimated: >60 mL/min (ref >60)
Glucose, Bld: 128 mg/dL — ABNORMAL HIGH (ref 70–99)
Potassium: 2.7 mmol/L — CL (ref 3.5–5.1)
Sodium: 144 mmol/L (ref 135–145)
Total Bilirubin: 0.5 mg/dL (ref 0.3–1.2)
Total Protein: 5.1 g/dL — ABNORMAL LOW (ref 6.5–8.1)

## 2020-08-13 LAB — FERRITIN: Ferritin: 850 ng/mL — ABNORMAL HIGH (ref 24–336)

## 2020-08-13 MED ORDER — ALBUTEROL SULFATE HFA 108 (90 BASE) MCG/ACT IN AERS: 2.0000 | INHALATION_SPRAY | Freq: Four times a day (QID) | RESPIRATORY_TRACT | Status: DC | PRN

## 2020-08-13 MED ORDER — BIOTENE DRY MOUTH MT LIQD
15.0000 mL | Freq: Two times a day (BID) | OROMUCOSAL | Status: DC
  Administered 2020-08-13 – 2020-08-15 (×4): 15 mL via TOPICAL

## 2020-08-13 MED ORDER — DEXTROSE 50 % IV SOLN
50.0000 mL | INTRAVENOUS | Status: AC
  Administered 2020-08-13: 04:00:00 50 mL via INTRAVENOUS

## 2020-08-13 MED ORDER — LORAZEPAM 2 MG/ML IJ SOLN: 1.0000 mg | INTRAMUSCULAR | Status: DC | PRN

## 2020-08-13 MED ORDER — DEXTROSE 50 % IV SOLN
INTRAVENOUS | Status: AC
  Filled 2020-08-13: qty 50

## 2020-08-13 MED ORDER — GLYCOPYRROLATE 1 MG PO TABS
1.0000 mg | ORAL_TABLET | ORAL | Status: DC | PRN
  Filled 2020-08-13: qty 1

## 2020-08-13 MED ORDER — LORAZEPAM 1 MG PO TABS: 1.0000 mg | ORAL_TABLET | ORAL | Status: DC | PRN

## 2020-08-13 MED ORDER — POTASSIUM CHLORIDE 10 MEQ/100ML IV SOLN
10.0000 meq | INTRAVENOUS | Status: AC
  Administered 2020-08-13 (×6): 10 meq via INTRAVENOUS
  Filled 2020-08-13 (×6): qty 100

## 2020-08-13 MED ORDER — MORPHINE SULFATE (PF) 2 MG/ML IV SOLN
1.0000 mg | INTRAVENOUS | Status: AC | PRN
  Administered 2020-08-13 – 2020-08-14 (×4): 2 mg via INTRAVENOUS
  Filled 2020-08-13 (×4): qty 1

## 2020-08-13 MED ORDER — HALOPERIDOL LACTATE 5 MG/ML IJ SOLN: 1.0000 mg | Freq: Four times a day (QID) | INTRAMUSCULAR | Status: DC | PRN

## 2020-08-13 MED ORDER — GLYCOPYRROLATE 0.2 MG/ML IJ SOLN: 0.2000 mg | INTRAMUSCULAR | Status: DC | PRN

## 2020-08-13 MED ORDER — DEXTROSE 50 % IV SOLN
INTRAVENOUS | Status: AC
  Administered 2020-08-13: 08:00:00 50 mL via INTRAVENOUS
  Filled 2020-08-13: qty 50

## 2020-08-13 MED ORDER — POLYVINYL ALCOHOL 1.4 % OP SOLN
1.0000 [drp] | Freq: Four times a day (QID) | OPHTHALMIC | Status: DC | PRN
  Filled 2020-08-13: qty 15

## 2020-08-13 MED ORDER — HALOPERIDOL 1 MG PO TABS
1.0000 mg | ORAL_TABLET | Freq: Four times a day (QID) | ORAL | Status: DC | PRN
  Filled 2020-08-13: qty 1

## 2020-08-13 MED ORDER — ONDANSETRON HCL 4 MG/2ML IJ SOLN: 4.0000 mg | Freq: Four times a day (QID) | INTRAMUSCULAR | Status: DC | PRN

## 2020-08-13 MED ORDER — ONDANSETRON 4 MG PO TBDP: 4.0000 mg | ORAL_TABLET | Freq: Four times a day (QID) | ORAL | Status: DC | PRN

## 2020-08-13 MED ORDER — LORAZEPAM 2 MG/ML PO CONC: 1.0000 mg | ORAL | Status: DC | PRN

## 2020-08-13 MED ORDER — HALOPERIDOL LACTATE 2 MG/ML PO CONC
1.0000 mg | Freq: Four times a day (QID) | ORAL | Status: DC | PRN
  Filled 2020-08-13: qty 0.5

## 2020-08-13 NOTE — Progress Notes (Signed)
CSW received consult for grief counseling resources. CSW spoke with patient's son, [name]. He expressed interest in grief counseling resources as the patient live with him, his fiance, and two kids. He reported that there are other siblings who are not involved with the patient who he feels will cause issues once he notifies them about the patient and he is requesting support throughout this time. CSW emailed grief counseling supports ([email or web address]).  Marshal Eskew LCSW

## 2020-08-13 NOTE — Progress Notes (Signed)
OT Cancellation Note  Patient Details Name: SAHEED CARRINGTON MRN: [id number] DOB: May 12, 1929   Cancelled Treatment:    Reason Eval/Treat Not Completed: Other (comment). Discussed with MD. Per MD asked to hold until after [facility] meeting.  Thornell Mule, OT/L   Acute OT Clinical Specialist Acute Rehabilitation Services Pager [phone] Office [phone]  08/13/2020, 9:47 AM

## 2020-08-13 NOTE — Progress Notes (Signed)
Inpatient Diabetes Program Recommendations  AACE/ADA: New Consensus Statement on Inpatient Glycemic Control (2015)  Target Ranges:  Prepandial:   less than 140 mg/dL      Peak postprandial:   less than 180 mg/dL (1-2 hours)      Critically ill patients:  140 - 180 mg/dL   Lab Results  Component Value Date   GLUCAP 74 08/13/2020   HGBA1C 5.9 (H) 08/11/2020    Review of Glycemic Control Results for DESTINY, TRICKEY (MRN [id number]) as of 08/13/2020 13:50  Ref. Range 08/13/2020 04:34 08/13/2020 07:36 08/13/2020 08:11 08/13/2020 12:07 08/13/2020 12:56  Glucose-Capillary Latest Ref Range: 70 - 99 mg/dL 038 (H) 48 (L) 882 (H) 48 (L) 74   Diabetes history: DM2 Outpatient Diabetes medications: Metformin 1 gm qd Current orders for Inpatient glycemic control: Novolog 4 units tid meal coverage if eats 50%  Inpatient Diabetes Program Recommendations:   -D/C Novolog meal coverage (has not received the past several meals due to hypoglycemia or not eating 50%) -Add Novolog 0-6 units tid correction with meals Secure chat sent to Dr. Randol Kern.  Thank you, Billy Fischer. Kerstie Agent, RN, MSN, CDE  Diabetes Coordinator Inpatient Glycemic Control Team Team Pager [phone] (8am-5pm) 08/13/2020 1:53 PM

## 2020-08-13 NOTE — Progress Notes (Signed)
   08/13/20 0847  Assess: MEWS Score  Temp 97.8 F (36.6 C)  BP 126/77  Pulse Rate 93  ECG Heart Rate (!) 115  Resp 20  Assess: MEWS Score  MEWS Temp 0  MEWS Systolic 0  MEWS Pulse 2  MEWS RR 0  MEWS LOC 0  MEWS Score 2  MEWS Score Color Yellow  Assess: if the MEWS score is Yellow or Red  Were vital signs taken at a resting state? Yes  Focused Assessment No change from prior assessment  Early Detection of Sepsis Score *See Row Information* Medium  MEWS guidelines implemented *See Row Information* Yes  Treat  MEWS Interventions Escalated (See documentation below)  Take Vital Signs  Increase Vital Sign Frequency  Yellow: Q 2hr X 2 then Q 4hr X 2, if remains yellow, continue Q 4hrs  Escalate  MEWS: Escalate Yellow: discuss with charge nurse/RN and consider discussing with provider and RRT  Notify: Charge Nurse/RN  Name of Charge Nurse/RN Notified kristin  Date Charge Nurse/RN Notified 08/13/20  Time Charge Nurse/RN Notified 0913  Document  Patient Outcome Not stable and remains on department  Progress note created (see row info) Yes

## 2020-08-13 NOTE — Progress Notes (Signed)
Daily Progress Note   Patient Name: Alejandro Hall       Date: 08/13/2020 DOB: 07/05/1929  Age: 86 y.o. MRN#: [id number] Attending Physician: Starleen Arms, MD Primary Care Physician: Kirby Funk, MD Admit Date: August 18, 2020  Reason for Consultation/Follow-up: Establishing goals of care  Subjective: Chart review performed. Received report from Dr. Randol Kern - acute concerns of patient declining despite interventions.   Called patient's son/Alejandro Hall - provided updates on patient's current medical situation and that, unfortunately, the patient is declining/not improving after 48 hours of watchful waiting. Emotional support was provided. Gently discussed Alejandro Hall's thoughts on transitioning to comfort care in house. Alejandro Hall expressed that he did not want the patient to suffer. We talked about transition to comfort measures in house and what that would entail inclusive of medications to control pain, dyspnea, agitation, nausea, itching, and hiccups. We discussed stopping all uneccessary measures such as blood draws, needle sticks, oxygen, antibiotics, CBGs/insulin, cardiac monitoring, and frequent vital signs. Alejandro Hall was agreeable to transition to comfort care today. Re-discussed hospice services, including home hospice vs residential hospice. Alejandro Hall requests the patient remain in house for EOL care as he is not interested in attempting to transport the patient in his frail state, which is reasonable.   Reviewed [name] with Alejandro Hall - he expressed understanding.   Alejandro Hall expresses that he anticipates having a hard time with this transition in his life as the patient lived with him. He also expresses anxiety about talking with the patient's other family members on his current  medical situation. Offered for our TOC to reach out to him to provided grief counseling resources - he expressed appreciation.  Emotional support and therapeutic listening was provided throughout visit.  All questions and concerns addressed. Encouraged to call with questions and/or concerns. PMT number previously provided.  Length of Stay: 2  Current Medications: Scheduled Meds:  . albuterol  2 puff Inhalation Q6H  . aspirin  300 mg Rectal Daily  . dexamethasone (DECADRON) injection  6 mg Intravenous Q24H  . enoxaparin (LOVENOX) injection  40 mg Subcutaneous Daily  . insulin aspart  4 Units Subcutaneous TID WC  . insulin detemir  0.15 Units/kg Subcutaneous BID    Continuous Infusions: . azithromycin 500 mg (08/13/20 0759)  . cefTRIAXone (ROCEPHIN)  IV 2 g (08/12/20 2129)  .  dextrose 5 % and 0.45% NaCl 60 mL/hr at 08/12/20 1641  . lactated ringers 100 mL/hr at 08/12/20 1125  . potassium chloride 10 mEq (08/13/20 1334)  . remdesivir 100 mg in NS 100 mL 100 mg (08/13/20 0923)    PRN Meds: [DISCONTINUED] acetaminophen **OR** acetaminophen (TYLENOL) oral liquid 160 mg/5 mL **OR** acetaminophen, metoprolol tartrate, morphine injection  Physical Exam Vitals and nursing note reviewed.  Constitutional:      General: He is not in acute distress.    Appearance: He is ill-appearing.     Comments: Frail appearing  Pulmonary:     Effort: No respiratory distress.  Neurological:     Mental Status: He is lethargic.     Motor: Weakness present.  Psychiatric:        Speech: He is noncommunicative.             Vital Signs: BP 123/70 (BP Location: Left Arm)   Pulse 83   Temp 97.7 F (36.5 C) (Oral)   Resp 20   Ht 5\' 8"  (1.727 m)   Wt 54.4 kg   SpO2 100%   BMI 18.25 kg/m  SpO2: SpO2: 100 % O2 Device: O2 Device: Nasal Cannula O2 Flow Rate: O2 Flow Rate (L/min): 3 L/min  Intake/output summary:   Intake/Output Summary (Last 24 hours) at 08/13/2020 1336 Last data filed at  08/13/2020 1018 Gross per 24 hour  Intake 3634.95 ml  Output 800 ml  Net 2834.95 ml   LBM:   Baseline Weight: Weight: 54.4 kg Most recent weight: Weight: 54.4 kg       Palliative Assessment/Data: PPS 10%    Flowsheet Rows   Flowsheet Row Most Recent Value  Intake Tab   Referral Department Hospitalist  Unit at Time of Referral ER  Palliative Care Primary Diagnosis Pulmonary  Date Notified 08/11/20  Palliative Care Type New Palliative care  Reason for referral Clarify Goals of Care  Date of Admission 08/03/2020  Date first seen by Palliative Care 08/11/20  # of days Palliative referral response time 0 Day(s)  # of days IP prior to Palliative referral 1  Clinical Assessment   Psychosocial & Spiritual Assessment   Palliative Care Outcomes   Patient/Family meeting held? Yes  Who was at the meeting? patient's son  Palliative Care Outcomes Improved pain interventions, Improved non-pain symptom therapy, Clarified goals of care, Provided psychosocial or spiritual support  Patient/Family wishes: Interventions discontinued/not started  Mechanical Ventilation, Trach, PEG, Tube feedings/TPN, BiPAP      Patient Active Problem List   Diagnosis Date Noted  . Pressure injury of skin 08/13/2020  . Acute CVA (cerebrovascular accident) (HCC) 08/11/2020  . Sepsis (HCC) 08/11/2020  . Severe sepsis (HCC) 08/11/2020  . Community acquired pneumonia of left lower lobe of lung   . DM type 2, goal HbA1c < 7% (HCC)   . Diabetes mellitus type 2, uncontrolled (HCC) 10/22/2015  . COPD exacerbation (HCC)   . Pulmonary HTN (HCC)   . COPD with exacerbation (HCC) 08/12/2014  . Acute respiratory failure with hypoxia (HCC) 08/12/2014  . HTN (hypertension) 08/12/2014  . Diabetes mellitus type 2, uncomplicated (HCC) 08/12/2014    Palliative Care Assessment & Plan   Patient Profile: 86 y.o. male  with past medical history of seizures, DM, COPD on chronic 3L O2, and hypertension presented to the ED on  07/31/2020 from home with his son stating he had increased AMS. Patient is being admitted on 07/29/2020 with COVID pneumonia, severe sepsis, acute respiratory failure,  and severe protein calorie malnutrition with albumin 2.3 on 08/11/20.  ED Course:Patient was oriented to name only. He was found to be hypoxic and was able to move all extremities.Although CT head was suggestive of acute vs subacute stroke, MRI brain was negative for CVA. Dr. Otelia Limes did not feel that the patient's presentation was consistent with CVA. CXR demonstrated diffuse airspace disease in the left thorax suspicious for acute pneumonia. The patient was started on IV cefepime and azithromycin. COVID 19 testing came back positive.  The patient has been evaluated by SLP, and he has been found incapable of managing his own secretions. He is now NPO.   Assessment: Severe sepsis Hypertension Diabetes mellitus type 2 Pulmonary HTN Pressure injury of skin COVID pneumonia Severe protein calorie malnutrition Acute metabolic encephalopathy Atrial fibrillation Terminal care  Recommendations/Plan: Initiated full comfort measures  Continue DNR/DNI as previously documented Family does not want transfer to residential hospice facility; patient will remain in house for EOL care - will be hospital death, likely hours to days Added orders for EOL symptom management and to reflect full comfort measures, as well as discontinued orders that were not focused on comfort TOC notified and consulted for: son would benefit from grief counseling, he was agreeable to get information on where he can access Unrestricted visitation orders were placed per current [place] COVID19 EOL visitation policy  Provide frequent assessments and administer PRN medications as clinically necessary to ensure EOL comfort PMT will continue to follow holistically  Goals of Care and Additional Recommendations:  Limitations on Scope of Treatment: Full Comfort  Care  Code Status:    Code Status Orders  (From admission, onward)         Start     Ordered   08/11/20 0534  Do not attempt resuscitation (DNR)  Continuous       Question Answer Comment  In the event of cardiac or respiratory ARREST Do not call a "code blue"   In the event of cardiac or respiratory ARREST Do not perform Intubation, CPR, defibrillation or ACLS   In the event of cardiac or respiratory ARREST Use medication by any route, position, wound care, and other measures to relive pain and suffering. May use oxygen, suction and manual treatment of airway obstruction as needed for comfort.      08/11/20 0536        Code Status History    Date Active Date Inactive Code Status Order ID Comments User Context   08/11/2020 0511 08/11/2020 0536 DNR [id number]  Pollyann Savoy, MD ED   10/22/2015 0601 10/26/2015 2031 DNR [id number]  Eduard Clos, MD ED   08/12/2014 1358 08/17/2014 1543 Full Code [id number]  Maretta Bees, MD Inpatient   Advance Care Planning Activity       Prognosis:   Hours - Days  Discharge Planning:  Anticipated Hospital Death  Care plan was discussed with primary RN, TOC, patient's son, Dr. Randol Kern  Thank you for allowing the Palliative Medicine Team to assist in the care of this patient.   Total Time 35 minutes Prolonged Time Billed  no       Greater than 50%  of this time was spent counseling and coordinating care related to the above assessment and plan.  Haskel Khan, NP  Please contact Palliative Medicine Team phone at [phone] for questions and concerns.

## 2020-08-13 NOTE — Progress Notes (Signed)
  Speech Language Pathology Treatment: Dysphagia  Patient Details Name: Alejandro Hall MRN: [id number] DOB: 1929-02-07 Today's Date: 08/13/2020 Time: 2505-3976 SLP Time Calculation (min) (ACUTE ONLY): 15 min  Assessment / Plan / Recommendation Clinical Impression  Pt was seen for dysphagia treatment. He was alert and cooperative during the session. RR was improved during this session and below 30. He tolerated puree solids and nectar thick liquids without overt s/sx of aspiration. Mastication was prolonged andmoderate oral residue was noted, but improved with a liquid wash. Pt continues to be symptomatic of aspiration with thin liquids; coughing was noted with even smaller boluses of thin liquids via cup. A modified barium swallow study is clinically indicated to further assess swallow function. However, a palliative care meeting is planned for today to further discuss goals of care and SLP will defer the study pending decision on GOC. A dysphagia 1 (puree) diet with nectar thick liquids will be initiated at this time. SLP will continue to follow pt.    HPI HPI: Pt is an 86 yo male adm to [facility] ED with respiratory deficits.  Pt PMH + for DM2,HTN, COPD, ? left sided weakness and acute vs subacute CVA.  Swallow eval ordered.  Pt also with chronic bladder obstruction. MRI brain 12/18:  No evidence of acute intracranial abnormality. CXR 12./17: Diffuse airspace disease in the left thorax suspicious for acute  pneumonia.      SLP Plan  Continue with current plan of care       Recommendations  Diet recommendations: Dysphagia 1 (puree);Nectar-thick liquid Liquids provided via: Cup;Straw Medication Administration: Crushed with puree Supervision: Full supervision/cueing for compensatory strategies;Staff to assist with self feeding Compensations: Slow rate;Small sips/bites;Follow solids with liquid Postural Changes and/or Swallow Maneuvers: Seated upright 90 degrees                Oral Care  Recommendations: Oral care BID Follow up Recommendations:  (TBD) SLP Visit Diagnosis: Dysphagia, unspecified (R13.10) Plan: Continue with current plan of care       Liberato Stansbery I. Vear Clock, MS, CCC-SLP Acute Rehabilitation Services Office number [phone] Pager [phone]               Scheryl Marten 08/13/2020, 1:27 PM

## 2020-08-13 NOTE — Progress Notes (Addendum)
PROGRESS NOTE  Alejandro Hall XHB:[id number] DOB: 07/27/1929 DOA: August 25, 2020 PCP: Kirby Funk, MD  Brief History     The patient is a 86 yr old man who lives at home with his son, Alejandro Hall. He carries a past medical history significant for arthritis, COPD, DM II, hypertension, seizure disorder, and history of kidney stones.  The patient was brought to [facility] on 08-25-2020 by his son for increasing confusion, inability to ambulate on the advise of the patient's PCP.   In the ED the patient was oriented to name only. He was found to be hypoxic and was able to move all extremities. The patient was discussed with Dr. Otelia Limes.  Although CT head was suggestive of acute vs subacute stroke, MRI brain was negative for CVA. Dr. Otelia Limes did not feel that the patient's presentation was consistent with CVA. CXR demonstrated diffuse airspace disease in the left thorax suspicious for acute pneumonia. The patient was started on IV cefepime and azithromycin. COVID 19 testing came back positive.  The patient has been evaluated by SLP, and he has been found incapable of managing his own secretions. He is now NPO. Blood sugars are supported with D10.  The patient has been placed on the COVID-19 protocol. His respiratory and mental status have declined since presentation. He is DNR, and with poor prognosis, palliative medicine has been consulted .   Consultants  . Neurology . Palliative  Procedures  . None  Antibiotics   Anti-infectives (From admission, onward)   Start     Dose/Rate Route Frequency Ordered Stop   08/12/20 1000  remdesivir 100 mg in sodium chloride 0.9 % 100 mL IVPB       "Followed by" Linked Group Details   100 mg 200 mL/hr over 30 Minutes Intravenous Daily 08/11/20 0900 08/24/2020 0959   08/11/20 2200  cefTRIAXone (ROCEPHIN) 2 g in sodium chloride 0.9 % 100 mL IVPB        2 g 200 mL/hr over 30 Minutes Intravenous Every 24 hours 08/11/20 0536     08/11/20 0845  remdesivir 200 mg in sodium  chloride 0.9% 250 mL IVPB       "Followed by" Linked Group Details   200 mg 580 mL/hr over 30 Minutes Intravenous Once 08/11/20 0900 08/11/20 1123   08/11/20 0800  azithromycin (ZITHROMAX) 500 mg in sodium chloride 0.9 % 250 mL IVPB        500 mg 250 mL/hr over 60 Minutes Intravenous Every 24 hours 08/11/20 0536     08/11/20 0315  ceFEPIme (MAXIPIME) 2 g in sodium chloride 0.9 % 100 mL IVPB        2 g 200 mL/hr over 30 Minutes Intravenous  Once 08/11/20 0310 08/11/20 0416   08/11/20 0315  vancomycin (VANCOCIN) IVPB 1000 mg/200 mL premix        1,000 mg 200 mL/hr over 60 Minutes Intravenous  Once 08/11/20 0310 08/11/20 0445   08/11/20 0315  piperacillin-tazobactam (ZOSYN) IVPB 4.5 g  Status:  Discontinued        4.5 g 200 mL/hr over 30 Minutes Intravenous  Once 08/11/20 0310 08/11/20 0316     Subjective  Patient is minimally responsive, does not follow any commands, he did develop A. fib overnight as discussed with staff, he still cannot have any oral intake as he is not awake enough.  Objective   Vitals:  Vitals:   08/13/20 0910 08/13/20 1047  BP:  123/70  Pulse:  83  Resp: 20 20  Temp:  97.9 F (36.6 C) 97.7 F (36.5 C)  SpO2: 100%    Exam:  Awake , extremely frail, deconditioned, confused minimally responsive, does not answer any questions or follow any commands . Diminished air entry bilaterally Irregular irregular,No Gallops,Rubs or new Murmurs, No Parasternal Heave +ve B.Sounds, Abd Soft, scaphoid abdomen No Cyanosis, Clubbing or edema, No new Rash or bruise     I have personally reviewed the following:   Today's Data  . Vitals, CMP, CBC, Procalcitonin, Lactic Acid, Ferritin, CRP, D Dimer, LDH, Troponin  Micro Data  . Blood cultures x 2: No growth . COVID-19: Positive  Imaging  . CXR . CT abdomen and pelvis . CT head . MRI Brain  Cardiology Data  . EKG  Scheduled Meds: . albuterol  2 puff Inhalation Q6H  . aspirin  300 mg Rectal Daily  .  dexamethasone (DECADRON) injection  6 mg Intravenous Q24H  . enoxaparin (LOVENOX) injection  40 mg Subcutaneous Daily  . insulin aspart  4 Units Subcutaneous TID WC   Continuous Infusions: . azithromycin 500 mg (08/13/20 0759)  . cefTRIAXone (ROCEPHIN)  IV 2 g (08/12/20 2129)  . dextrose 5 % and 0.45% NaCl 60 mL/hr at 08/12/20 1641  . potassium chloride 10 mEq (08/13/20 1334)  . remdesivir 100 mg in NS 100 mL 100 mg (08/13/20 0923)    Principal Problem:   Sepsis (HCC) Active Problems:   HTN (hypertension)   Diabetes mellitus type 2, uncomplicated (HCC)   Pulmonary HTN (HCC)   Severe sepsis (HCC)   Pressure injury of skin   LOS: 2 days   A & P    Severe Sepsis, present on admission: -  Tachycardia, tachypnea, hypotension, altered mental status, elevated creatinine, elevated WBC, in the setting of bacterial and covid pneumonia. -Proalcitonin is elevated at 2.32, continue with IV antibiotics for bacterial pneumonia -1/2 blood cultures positive for gram-positive cocci in chains, this is likely contaminant, will continue to monitor.  COVID pneumonia:  - The patient is receiving remdesivir, IV steroids, supplemental O2, vitamin C, zinc, nebulizer treatments. Supplemental O2. Given increased procalcitonin will continue IV antibiotics for now. His respiratory and mental status have declined since presentation. He is DNR, but I have discussed the patient with his son, Alejandro Hall, and told him that I felt that comfort care would be appropriate if the patient continues to decline as he is has very little reserve on which to rely on in the face of this overwhelming illness.  Acute on chronic hypoxic respiratory failure;  -She is on 3 L oxygen at home, initially requiring up to 6 L, this has improved, currently back to baseline .  DM II:  - Hb A1c was 5.9.  -He has hypoglycemia, so sliding scale has been stopped, I have stopped his Levemir as well, continue with D5 half-normal saline, he had  couple low CBGs, so we will increase 200 cc/h.  Severe protein calorie malnutrtion: Evident upon presentation. Pt is now NPO due to his severe dysphagia. Will consult nutrition for their on advise on how to best support this severely malnurished elderly man.  Acute embolic encephalopathy -No evidence of stroke on MRI, encephalopathy most likely in the setting of sepsis, and COVID-19 encephalopathy.  Pulmonary Hypertension: Noted. Complicates care of respiratory status and hypoxia.  A. fib  -Heart rate is controlled, developed overnight, await further discussion about goals of care before recommendation regarding further management. -correct  hypokalemia  Hypokalemia -2.7, repleted with IV as unable to take any  oral  Goals of care: -palliative  medicine input greatly appreciated. -Discussed with son again about goals of care, CODE STATUS remains the same, discussed goals of care, at this point decision has been made to monitor on current measures including antibiotics, fluids and medical management with no escalation of care over the next 24 hours, and will reassess comfort care decision with son tomorrow pending clinical progression.  DVT Prophylaxis: Lovenox CODE STATUS: DNR.  Family Communication: I have discussed the patient son on 12/19, Palliative will D/W patient son today about goals of care. Disposition: Status is: Inpatient  Remains inpatient appropriate because:Inpatient level of care appropriate due to severity of illness   Dispo: The patient is from: Home              Anticipated d/c is to: TBD              Anticipated d/c date is: 3 days              Patient currently is not medically stable to d/c.   Huey Bienenstock MD [facility] Direct contact: see [email or web address]  7PM-7AM contact night coverage as above

## 2020-08-14 DIAGNOSIS — E162 Hypoglycemia, unspecified: Secondary | ICD-10-CM

## 2020-08-14 DIAGNOSIS — G9341 Metabolic encephalopathy: Secondary | ICD-10-CM

## 2020-08-14 DIAGNOSIS — E876 Hypokalemia: Secondary | ICD-10-CM

## 2020-08-14 DIAGNOSIS — U071 COVID-19: Secondary | ICD-10-CM

## 2020-08-14 DIAGNOSIS — R627 Adult failure to thrive: Secondary | ICD-10-CM

## 2020-08-14 DIAGNOSIS — I4891 Unspecified atrial fibrillation: Secondary | ICD-10-CM

## 2020-08-14 DIAGNOSIS — E43 Unspecified severe protein-calorie malnutrition: Secondary | ICD-10-CM

## 2020-08-14 LAB — CULTURE, BLOOD (ROUTINE X 2)

## 2020-08-14 MED ORDER — MORPHINE SULFATE (PF) 2 MG/ML IV SOLN
2.0000 mg | INTRAVENOUS | Status: AC
  Administered 2020-08-14 – 2020-08-15 (×6): 2 mg via INTRAVENOUS
  Filled 2020-08-14 (×6): qty 1

## 2020-08-14 NOTE — Progress Notes (Signed)
Palliative Medicine RN Note: Symptom check.  Spoke with RN. Pt got one dose of morphine on his shift and has been comfortable. However, HR remains elevated and RR over 30, so we will schedule morphine to try to prevent distress.  Margret Chance Perline Awe, RN, BSN, [facility] Palliative Medicine Team 08/14/2020 4:19 PM Office [phone]

## 2020-08-14 NOTE — Progress Notes (Signed)
PROGRESS NOTE                                                                             PROGRESS NOTE                                                                                                                                                                                                             Patient Demographics:    Alejandro Hall, is a 86 y.o. male, DOB - 1928-11-09, ZOX:[id number]  Outpatient Primary MD for the patient is Kirby Funk, MD    LOS - 3  Admit date - 08/20/2020    Chief Complaint  Patient presents with  . Altered Mental Status       Brief Narrative     The patient is a 86 yr old man who lives at home with his son, Alejandro Hall. He carries a past medical history significant for arthritis, COPD, DM II, hypertension, seizure disorder, and history of kidney stones.  The patient was brought to [facility] on 07/31/2020 by his son for increasing confusion, inability to ambulate on the advise of the patient's PCP.   In the ED the patient was oriented to name only. He was found to be hypoxic and was able to move all extremities. The patient was discussed with Dr. Otelia Limes.  Although CT head was suggestive of acute vs subacute stroke, MRI brain was negative for CVA. Dr. Otelia Limes did not feel that the patient's presentation was consistent with CVA. CXR demonstrated diffuse airspace disease in the left thorax suspicious for acute pneumonia. The patient was started on IV cefepime and azithromycin. COVID 19 testing came back positive.  The patient has been evaluated by SLP, and he has been found incapable of managing his own secretions. He is now NPO. Blood sugars are supported with D10.  The patient has been placed on the COVID-19 protocol. His respiratory and mental status have declined since presentation. He is DNR, and with poor prognosis, palliative medicine has been consulted, patient was kept with medical management, no  escalation of care  for 48 hours, with no improvement, so decision has been made to proceed with comfort care after family discussion with palliative medicine.    Subjective:    Aldona Lento today with no significant events as discussed with staff, patient appears to be comfortable with no significant events .   Assessment  & Plan :    Principal Problem:   Sepsis (HCC) Active Problems:   Acute respiratory failure with hypoxia (HCC)   HTN (hypertension)   Diabetes mellitus type 2, uncomplicated (HCC)   Pulmonary hypertension (HCC)   Severe sepsis (HCC)   Pressure injury of skin   Hypokalemia   Hypoglycemia   Severe protein-calorie malnutrition (HCC)   Failure to thrive in adult   Unspecified atrial fibrillation (HCC)   Acute metabolic encephalopathy   Pneumonia due to COVID-19 virus   Severe Sepsis, present on admission: -  Tachycardia, tachypnea, hypotension, altered mental status, elevated creatinine, elevated WBC, in the setting of bacterial and covid pneumonia. -Proalcitonin is elevated at 2.32. -1/2 blood cultures positive for gram-positive cocci. -Currently full comfort measure, off antibiotics  COVID pneumonia:  -Treated with steroids, Remdesivir,. -Full comfort measures [name]  Acute on chronic hypoxic respiratory failure;  -he is on 3 L oxygen at home, initially requiring up to 6 L.  DM II:  - Hb A1c was 5.9.   Severe protein calorie malnutrtion/failure to thrive  -BMI of 18, albumin of 1.7 .  Acute Metabolic  encephalopathy -No evidence of stroke on MRI, encephalopathy most likely in the setting of sepsis, and COVID-19 encephalopathy.  Pulmonary Hypertension:   A. fib  -New diagnosis  Hypokalemia - repleted  Goals of care: -palliative  medicine input greatly appreciated.  Patient extremely frail, deconditioned, with multiple comorbidities, no improvement with antibiotics, remdesivir and steroids, remains profoundly encephalopathic, frail and deconditioned, currently  full comfort measures.    SpO2: 93 % O2 Flow Rate (L/min): 3 L/min  Recent Labs  Lab 07/27/2020 2036 08/11/20 0513 08/11/20 0520 08/11/20 0550 08/11/20 0759 08/11/20 1217 08/12/20 0428 08/13/20 0514  WBC 10.5  --   --  16.0*  --   --  20.4* 20.8*  PLT 343  --   --  246  --   --  255 291  CRP  --   --   --   --   --  23.1* 25.3* 27.0*  DDIMER  --   --   --   --   --  1.66* 1.84*  --   PROCALCITON  --   --   --  2.32  --   --   --   --   AST 56*  --   --  50*  --   --  52* 56*  ALT 27  --   --  23  --   --  21 26  ALKPHOS 76  --   --  63  --   --  65 62  BILITOT 0.8  --   --  1.3*  --   --  0.4 0.5  ALBUMIN 2.9*  --   --  2.3*  --   --  1.9* 1.7*  INR 1.0  --   --  1.0  --   --   --   --   LATICACIDVEN 3.1*  --  10.3* 1.3 1.1  --   --   --   SARSCOV2NAA  --  POSITIVE*  --   --   --   --   --   --  ABG     Component Value Date/Time   PHART 7.317 (L) 10/22/2015 0709   PCO2ART 32.3 (L) 10/22/2015 0709   PO2ART 83.0 10/22/2015 0709   HCO3 29.3 (H) 01/26/2016 2048   TCO2 31 01/26/2016 2048   ACIDBASEDEF 8.0 (H) 10/22/2015 0709   O2SAT 61.0 01/26/2016 2048          Condition - Extremely Guarded  Family Communication  : Son by phone  Code Status : NR/comfort care  Consults  : Palliative  medicine   Status is: Inpatient  Remains inpatient appropriate because:IV treatments appropriate due to intensity of illness or inability to take PO   Dispo: The patient is from: Home              Anticipated d/c is to: Expected hospital death              Anticipated d/c date is: 2 days              Patient currently is not medically stable to d/c.      DVT Prophylaxis  : Comfort   Lab Results  Component Value Date   PLT 291 08/13/2020    Diet :  Diet Order            DIET - DYS 1 Room service appropriate? No; Fluid consistency: Nectar Thick  Diet effective now                  Inpatient Medications  Scheduled Meds: . antiseptic oral rinse  15  mL Topical BID   Continuous Infusions: PRN Meds:.[DISCONTINUED] acetaminophen **OR** acetaminophen (TYLENOL) oral liquid 160 mg/5 mL **OR** acetaminophen, albuterol, glycopyrrolate **OR** glycopyrrolate **OR** glycopyrrolate, haloperidol **OR** haloperidol **OR** haloperidol lactate, LORazepam **OR** LORazepam **OR** LORazepam, morphine injection, ondansetron **OR** ondansetron (ZOFRAN) IV, polyvinyl alcohol  Antibiotics  :    Anti-infectives (From admission, onward)   Start     Dose/Rate Route Frequency Ordered Stop   08/12/20 1000  remdesivir 100 mg in sodium chloride 0.9 % 100 mL IVPB  Status:  Discontinued       "Followed by" Linked Group Details   100 mg 200 mL/hr over 30 Minutes Intravenous Daily 08/11/20 0900 08/13/20 1439   08/11/20 2200  cefTRIAXone (ROCEPHIN) 2 g in sodium chloride 0.9 % 100 mL IVPB  Status:  Discontinued        2 g 200 mL/hr over 30 Minutes Intravenous Every 24 hours 08/11/20 0536 08/13/20 1439   08/11/20 0845  remdesivir 200 mg in sodium chloride 0.9% 250 mL IVPB       "Followed by" Linked Group Details   200 mg 580 mL/hr over 30 Minutes Intravenous Once 08/11/20 0900 08/11/20 1123   08/11/20 0800  azithromycin (ZITHROMAX) 500 mg in sodium chloride 0.9 % 250 mL IVPB  Status:  Discontinued        500 mg 250 mL/hr over 60 Minutes Intravenous Every 24 hours 08/11/20 0536 08/13/20 1439   08/11/20 0315  ceFEPIme (MAXIPIME) 2 g in sodium chloride 0.9 % 100 mL IVPB        2 g 200 mL/hr over 30 Minutes Intravenous  Once 08/11/20 0310 08/11/20 0416   08/11/20 0315  vancomycin (VANCOCIN) IVPB 1000 mg/200 mL premix        1,000 mg 200 mL/hr over 60 Minutes Intravenous  Once 08/11/20 0310 08/11/20 0445   08/11/20 0315  piperacillin-tazobactam (ZOSYN) IVPB 4.5 g  Status:  Discontinued        4.5  g 200 mL/hr over 30 Minutes Intravenous  Once 08/11/20 0310 08/11/20 0316        Mliss Fritz Devoiry Corriher M.D on 08/14/2020 at 12:05 PM  To page go to [email or web address]   [facility] -  Office  [phone]   ils    Objective:   Vitals:   08/13/20 0910 08/13/20 1047 08/13/20 1257 08/13/20 2000  BP:  123/70 126/77 (!) 144/88  Pulse:  83 91 92  Resp: (!) 29  Temp: 97.9 F (36.6 C) 97.7 F (36.5 C) 98.5 F (36.9 C) (!) 97.5 F (36.4 C)  TempSrc: Oral Oral Oral Oral  SpO2: 100%   93%  Weight:      Height:        Wt Readings from Last 3 Encounters:  08/11/20 54.4 kg  10/26/15 87.5 kg  08/16/14 80.7 kg     Intake/Output Summary (Last 24 hours) at 08/14/2020 1205 Last data filed at 08/14/2020 0522 Gross per 24 hour  Intake 0 ml  Output 1450 ml  Net -1450 ml     Physical Exam  He is sleeping, but open his eyes intermittently, does not answer questions or follow any commands . Extremely frail, deconditioned and cachectic  Diminished air entry bilaterally Irregular irregular Abdomen soft, nontender, bowel sounds present, scaphoid Extremity with no edema or cyanosis    Data Review:    CBC Recent Labs  Lab 09-05-2020 2036 08/11/20 0550 08/12/20 0428 08/13/20 0514  WBC 10.5 16.0* 20.4* 20.8*  HGB 12.3* 10.9* 10.9* 11.3*  HCT 40.0 34.7* 35.7* 36.4*  PLT 343 246 255 291  MCV 87.9 87.2 89.7 88.3  MCH 27.0 27.4 27.4 27.4  MCHC 30.8 31.4 30.5 31.0  RDW 14.3 14.5 14.7 14.6  LYMPHSABS 0.7 0.4* 0.0* 0.5*  MONOABS 0.5 0.5 0.0* 0.7  EOSABS 0.0 0.0 0.0 0.0  BASOSABS 0.0 0.0 0.0 0.0    Recent Labs  Lab September 05, 2020 2036 08/11/20 0520 08/11/20 0550 08/11/20 0759 08/11/20 1217 08/12/20 0428 08/13/20 0514  NA 140  --  139  --   --  144 144  K 3.5  --  3.2*  --   --  3.1* 2.7*  CL 88*  --  92*  --   --  97* 98  CO2 30  --  30  --   --  34* 38*  GLUCOSE 98  --  95  --   --  191* 128*  BUN 27*  --  30*  --   --  25* 22  CREATININE 1.26*  --  1.45*  --   --  1.06 0.86  CALCIUM 8.6*  --  7.9*  --   --  7.8* 7.8*  AST 56*  --  50*  --   --  52* 56*  ALT 27  --  23  --   --  21 26  ALKPHOS 76  --  63  --   --  65 62   BILITOT 0.8  --  1.3*  --   --  0.4 0.5  ALBUMIN 2.9*  --  2.3*  --   --  1.9* 1.7*  CRP  --   --   --   --  23.1* 25.3* 27.0*  DDIMER  --   --   --   --  1.66* 1.84*  --   PROCALCITON  --   --  2.32  --   --   --   --   LATICACIDVEN  3.1* 10.3* 1.3 1.1  --   --   --   INR 1.0  --  1.0  --   --   --   --   HGBA1C  --   --  5.9*  --   --   --   --     ------------------------------------------------------------------------------------------------------------------ No results for input(s): CHOL, HDL, LDLCALC, TRIG, CHOLHDL, LDLDIRECT in the last 72 hours.  Lab Results  Component Value Date   HGBA1C 5.9 (H) 08/11/2020   ------------------------------------------------------------------------------------------------------------------ No results for input(s): TSH, T4TOTAL, T3FREE, THYROIDAB in the last 72 hours.  Invalid input(s): FREET3  Cardiac Enzymes No results for input(s): CKMB, TROPONINI, MYOGLOBIN in the last 168 hours.  Invalid input(s): CK ------------------------------------------------------------------------------------------------------------------    Component Value Date/Time   BNP 19.4 01/26/2016 2030    Micro Results Recent Results (from the past 240 hour(s))  Culture, blood (Routine x 2)     Status: None (Preliminary result)   Collection Time: 08/06/2020  8:15 PM   Specimen: BLOOD  Result Value Ref Range Status   Specimen Description BLOOD RIGHT ARM  Final   Special Requests   Final    BOTTLES DRAWN AEROBIC AND ANAEROBIC Blood Culture adequate volume   Culture   Final    NO GROWTH 4 DAYS Performed at [facility], [address] [place], [place] [address]    Report Status PENDING  Incomplete  Blood Culture ID Panel (Reflexed)     Status: None   Collection Time: 08/17/2020  8:36 PM  Result Value Ref Range Status   Enterococcus faecalis NOT DETECTED NOT DETECTED Final   Enterococcus Faecium NOT DETECTED NOT DETECTED Final   Listeria monocytogenes NOT  DETECTED NOT DETECTED Final   Staphylococcus species NOT DETECTED NOT DETECTED Final   Staphylococcus aureus (BCID) NOT DETECTED NOT DETECTED Final   Staphylococcus epidermidis NOT DETECTED NOT DETECTED Final   Staphylococcus lugdunensis NOT DETECTED NOT DETECTED Final   Streptococcus species NOT DETECTED NOT DETECTED Final   Streptococcus agalactiae NOT DETECTED NOT DETECTED Final   Streptococcus pneumoniae NOT DETECTED NOT DETECTED Final   Streptococcus pyogenes NOT DETECTED NOT DETECTED Final   A.calcoaceticus-baumannii NOT DETECTED NOT DETECTED Final   Bacteroides fragilis NOT DETECTED NOT DETECTED Final   Enterobacterales NOT DETECTED NOT DETECTED Final   Enterobacter cloacae complex NOT DETECTED NOT DETECTED Final   Escherichia coli NOT DETECTED NOT DETECTED Final   Klebsiella aerogenes NOT DETECTED NOT DETECTED Final   Klebsiella oxytoca NOT DETECTED NOT DETECTED Final   Klebsiella pneumoniae NOT DETECTED NOT DETECTED Final   Proteus species NOT DETECTED NOT DETECTED Final   Salmonella species NOT DETECTED NOT DETECTED Final   Serratia marcescens NOT DETECTED NOT DETECTED Final   Haemophilus influenzae NOT DETECTED NOT DETECTED Final   Neisseria meningitidis NOT DETECTED NOT DETECTED Final   Pseudomonas aeruginosa NOT DETECTED NOT DETECTED Final   Stenotrophomonas maltophilia NOT DETECTED NOT DETECTED Final   Candida albicans NOT DETECTED NOT DETECTED Final   Candida auris NOT DETECTED NOT DETECTED Final   Candida glabrata NOT DETECTED NOT DETECTED Final   Candida krusei NOT DETECTED NOT DETECTED Final   Candida parapsilosis NOT DETECTED NOT DETECTED Final   Candida tropicalis NOT DETECTED NOT DETECTED Final   Cryptococcus neoformans/gattii NOT DETECTED NOT DETECTED Final    Comment: Performed at [facility], [address] [place], [place] [address]  Culture, blood (Routine x 2)     Status: Abnormal   Collection Time:  07/30/2020  8:38 PM   Specimen: BLOOD  Result  Value Ref Range Status   Specimen Description BLOOD LEFT ARM  Final   Special Requests   Final    BOTTLES DRAWN AEROBIC ONLY Blood Culture results may not be optimal due to an inadequate volume of blood received in culture bottles   Culture  Setup Time (A)  Final    GRAM VARIABLE COCCI AEROBIC BOTTLE ONLY CRITICAL RESULT CALLED TO, READ BACK BY AND VERIFIED WITH: PHARMD J EAKINS AT 2027 08/11/20 BY L BENFIELD    Culture (A)  Final    NEISSERIA SPECIES Standardized susceptibility testing for this organism is not available. Performed at [facility], [address], [place], [place] [address]    Report Status 08/14/2020 FINAL  Final  Resp Panel by RT-PCR (Flu A&B, Covid) Nasopharyngeal Swab     Status: Abnormal   Collection Time: 08/11/20  5:13 AM   Specimen: Nasopharyngeal Swab; Nasopharyngeal(NP) swabs in vial transport medium  Result Value Ref Range Status   SARS Coronavirus 2 by RT PCR POSITIVE (A) NEGATIVE Final    Comment: RESULT CALLED TO, READ BACK BY AND VERIFIED WITH: AMarga Hoots RN, AT 5597 08/11/20 BY D. VANHOOK (NOTE) SARS-CoV-2 target nucleic acids are DETECTED.  The SARS-CoV-2 RNA is generally detectable in upper respiratory specimens during the acute phase of infection. Positive results are indicative of the presence of the identified virus, but do not rule out bacterial infection or co-infection with other pathogens not detected by the test. Clinical correlation with patient history and other diagnostic information is necessary to determine patient infection status. The expected result is Negative.  Fact Sheet for Patients: BloggerCourse.com  Fact Sheet for Healthcare Providers: SeriousBroker.it  This test is not yet approved or cleared by the [place] FDA and  has been authorized for detection and/or diagnosis of SARS-CoV-2 by FDA under an Emergency Use Authorization (EUA).  This EUA will remain in  effect (meaning this  test can be used) for the duration of  the COVID-19 declaration under Section 564(b)(1) of the Act, 21 U.S.C. section 360bbb-3(b)(1), unless the authorization is terminated or revoked sooner.     Influenza A by PCR NEGATIVE NEGATIVE Final   Influenza B by PCR NEGATIVE NEGATIVE Final    Comment: (NOTE) The Xpert Xpress SARS-CoV-2/FLU/RSV plus assay is intended as an aid in the diagnosis of influenza from Nasopharyngeal swab specimens and should not be used as a sole basis for treatment. Nasal washings and aspirates are unacceptable for Xpert Xpress SARS-CoV-2/FLU/RSV testing.  Fact Sheet for Patients: BloggerCourse.com  Fact Sheet for Healthcare Providers: SeriousBroker.it  This test is not yet approved or cleared by the [place] FDA and has been authorized for detection and/or diagnosis of SARS-CoV-2 by FDA under an Emergency Use Authorization (EUA). This EUA will remain in effect (meaning this test can be used) for the duration of the COVID-19 declaration under Section 564(b)(1) of the Act, 21 U.S.C. section 360bbb-3(b)(1), unless the authorization is terminated or revoked.  Performed at [facility], [address], [place], [place] [address]     Radiology Reports DG Chest 2 View  Result Date: 08/01/2020 CLINICAL DATA:  Sepsis altered EXAM: CHEST - 2 VIEW COMPARISON:  01/26/2016 FINDINGS: Hyperinflation. Diffuse airspace disease in the left thorax suspicious for acute pneumonia. Stable cardiomediastinal silhouette with aortic atherosclerosis. No pneumothorax IMPRESSION: Diffuse airspace disease in the left thorax suspicious for acute pneumonia. Electronically Signed   By: Selena Batten  Jake Samples M.D.   On: August 19, 2020 21:02   CT Head Wo Contrast  Result Date: 08/11/2020 CLINICAL DATA:  Encephalopathy EXAM: CT HEAD WITHOUT CONTRAST TECHNIQUE: Contiguous axial images were obtained from the base of the  skull through the vertex without intravenous contrast. COMPARISON:  None. FINDINGS: Brain: There is hypoattenuation within the left brainstem. Diffuse generalized atrophy with periventricular white matter hypoattenuation. No acute hemorrhage. Vascular: Atherosclerotic calcification of the internal carotid arteries at the skull base. No abnormal hyperdensity of the major intracranial arteries or dural venous sinuses. Skull: The visualized skull base, calvarium and extracranial soft tissues are normal. Sinuses/Orbits: Partial opacification of the left maxillary sinus. The orbits are normal. IMPRESSION: 1. Hypoattenuation within the left brainstem, consistent with acute or subacute infarct. 2. No acute hemorrhage or mass effect. Electronically Signed   By: Deatra Robinson M.D.   On: 08/11/2020 04:49   MR BRAIN WO CONTRAST  Result Date: 08/11/2020 CLINICAL DATA:  Neuro deficit, acute stroke suspected. EXAM: MRI HEAD WITHOUT CONTRAST TECHNIQUE: Multiplanar, multiecho pulse sequences of the brain and surrounding structures were obtained without intravenous contrast. COMPARISON:  CT head from the same day. FINDINGS: Brain: No acute infarction, acute hemorrhage, hydrocephalus, extra-axial collection or mass lesion. Patchy T2/FLAIR hyperintensities within the white matter, compatible with chronic microvascular ischemic disease. Moderate generalized cerebral volume loss with ex vacuo ventricular dilation. Vascular: Major arterial flow voids are maintained at the skull base. Skull and upper cervical spine: Normal marrow signal. Cervical degenerative change. Sinuses/Orbits: Scattered paranasal sinus mucosal thickening with air-fluid levels and frothy secretions in the left maxillary sinus and right sphenoid sinus. Other: Small bilateral mastoid effusions. IMPRESSION: 1. No evidence of acute intracranial abnormality. Specifically, no acute infarct. 2. Chronic microvascular ischemic disease and generalized cerebral volume  loss. 3. Scattered paranasal sinus mucosal thickening with air-fluid levels and frothy secretions in the left maxillary sinus and right sphenoid sinus. Findings could reflect sinusitis in the appropriate clinical setting. Electronically Signed   By: Feliberto Harts MD   On: 08/11/2020 07:08   CT Abdomen Pelvis W Contrast  Result Date: 08/11/2020 CLINICAL DATA:  Sepsis.  Suspected abdominal infection or abscess. EXAM: CT ABDOMEN AND PELVIS WITH CONTRAST TECHNIQUE: Multidetector CT imaging of the abdomen and pelvis was performed using the standard protocol following bolus administration of intravenous contrast. CONTRAST:  OMNIPAQUE IOHEXOL 300 MG/ML  SOLN COMPARISON:  Noncontrast CT on 09/23/2010 FINDINGS: Lower Chest: New airspace disease is seen in the peripheral left lower lobe with tiny left pleural effusion. Emphysema also noted. Hepatobiliary: Probable tiny hepatic cysts noted. Postop changes from previous right hepatectomy and cholecystectomy. No definite liver masses identified. No evidence of biliary ductal dilatation. Pancreas:  No mass or inflammatory changes. Spleen: Within normal limits in size and appearance. Adrenals/Urinary Tract: Stable nodularity of left adrenal gland. A few tiny renal cysts are seen bilaterally. No masses identified. A few tiny 2-3 mm calculi are noted in the left kidney. No evidence of ureteral calculi or hydronephrosis. A few tiny 1-2 mm calculi are also seen in the urinary bladder. Diffuse bladder wall thickening is seen, likely due to chronic bladder outlet obstruction given enlarged prostate. Stomach/Bowel: No evidence of obstruction, inflammatory process or abnormal fluid collections. Vascular/Lymphatic: No pathologically enlarged lymph nodes. No abdominal aortic aneurysm. Aortic atherosclerotic calcification noted. Reproductive:  Mildly enlarged prostate gland again noted. Other:  None. Musculoskeletal:  No suspicious bone lesions identified. IMPRESSION: New  airspace disease in peripheral left lower lobe with tiny left pleural effusion, suspicious  for pneumonia. No abscess or other acute findings within the abdomen or pelvis. Tiny nonobstructing left renal and urinary bladder calculi. No evidence of ureteral calculi or hydronephrosis. Mildly enlarged prostate and findings of chronic bladder outlet obstruction. Aortic Atherosclerosis (ICD10-I70.0) and Emphysema (ICD10-J43.9). Electronically Signed   By: Danae Orleans M.D.   On: 08/11/2020 04:50

## 2020-08-15 LAB — CULTURE, BLOOD (ROUTINE X 2)
Culture: NO GROWTH
Special Requests: ADEQUATE

## 2020-08-15 MED ORDER — LORAZEPAM 2 MG/ML IJ SOLN: 1.0000 mg | INTRAMUSCULAR | Status: DC | PRN

## 2020-08-15 MED ORDER — MORPHINE BOLUS VIA INFUSION
2.0000 mg | INTRAVENOUS | Status: DC | PRN
  Filled 2020-08-15: qty 2

## 2020-08-15 MED ORDER — MORPHINE 100MG IN NS 100ML (1MG/ML) PREMIX INFUSION
1.0000 mg/h | INTRAVENOUS | Status: DC
  Administered 2020-08-15: 14:00:00 1 mg/h via INTRAVENOUS
  Filled 2020-08-15: qty 100

## 2020-08-15 MED ORDER — LORAZEPAM 2 MG/ML IJ SOLN
1.0000 mg | INTRAMUSCULAR | Status: DC
  Administered 2020-08-15 (×2): 1 mg via INTRAVENOUS
  Filled 2020-08-15 (×3): qty 1

## 2020-08-15 NOTE — Progress Notes (Signed)
PROGRESS NOTE                                                                             PROGRESS NOTE                                                                                                                                                                                                             Patient Demographics:    Alejandro Hall, is a 86 y.o. male, DOB - 11/30/1928, ZOX:[id number]  Outpatient Primary MD for the patient is Alejandro FunkGriffin, John, MD    LOS - 4  Admit date - Jan 09, 2020    Chief Complaint  Patient presents with  . Altered Mental Status       Brief Narrative     The patient is a 86 yr old man who lives at home with his son, Alejandro Hall. He carries a past medical history significant for arthritis, COPD, DM II, hypertension, seizure disorder, and history of kidney stones.  The patient [facility], [facility] by his son for increasing confusion, inability to ambulate on the advise of the patient's PCP.   In the ED the patient was oriented to name only. He was found to be hypoxic and was able to move all extremities. The patient was discussed with Dr. Otelia LimesLindzen.  Although CT head was suggestive of acute vs subacute stroke, MRI brain was negative for CVA. Dr. Otelia LimesLindzen did not feel that the patient's presentation was consistent with CVA. CXR demonstrated diffuse airspace disease in the left thorax suspicious for acute pneumonia. The patient was started on IV cefepime and azithromycin. COVID 19 testing came back positive.  The patient has been evaluated by SLP, and he has been found incapable of managing his own secretions. He is now NPO. Blood sugars are supported with D10.  The patient has been placed on the COVID-19 protocol. His respiratory and mental status have declined since presentation. He is DNR, and with poor prognosis, palliative medicine has been consulted, patient was kept with medical management, no  escalation of care  for 48 hours, with no improvement, so decision has been made to proceed with comfort care after family discussion with palliative medicine.    Subjective:    Alejandro Hall today with no significant events as discussed with staff, patient appears to be comfortable with no significant events .   Assessment  & Plan :    Principal Problem:   Sepsis (HCC) Active Problems:   Acute respiratory failure with hypoxia (HCC)   HTN (hypertension)   Diabetes mellitus type 2, uncomplicated (HCC)   Pulmonary hypertension (HCC)   Severe sepsis (HCC)   Pressure injury of skin   Hypokalemia   Hypoglycemia   Severe protein-calorie malnutrition (HCC)   Failure to thrive in adult   Unspecified atrial fibrillation (HCC)   Acute metabolic encephalopathy   Pneumonia due to COVID-19 virus   Severe Sepsis, present on admission: -  Tachycardia, tachypnea, hypotension, altered mental status, elevated creatinine, elevated WBC, in the setting of bacterial and covid pneumonia. -Proalcitonin is elevated at 2.32. -1/2 blood cultures positive for gram-positive cocci. -Currently full comfort measure, off antibiotics  COVID pneumonia:  -Treated with steroids, Remdesivir,. -Full comfort measures [name]  Acute on chronic hypoxic respiratory failure;  -he is on 3 L oxygen at home, initially requiring up to 6 L.  DM II:  - Hb A1c was 5.9.   Severe protein calorie malnutrtion/failure to thrive  -BMI of 18, albumin of 1.7 .  Acute Metabolic  encephalopathy -No evidence of stroke on MRI, encephalopathy most likely in the setting of sepsis, and COVID-19 encephalopathy.  Pulmonary Hypertension:   A. fib  -New diagnosis  Hypokalemia - repleted  Goals of care: -palliative  medicine input greatly appreciated.  Patient extremely frail, deconditioned, with multiple comorbidities, no improvement with antibiotics, remdesivir and steroids, remains profoundly encephalopathic, frail and deconditioned, currently  full comfort measures.    SpO2: 93 % O2 Flow Rate (L/min): 3 L/min  Recent Labs  Lab 07/27/2020 2036 08/11/20 0513 08/11/20 0520 08/11/20 0550 08/11/20 0759 08/11/20 1217 08/12/20 0428 08/13/20 0514  WBC 10.5  --   --  16.0*  --   --  20.4* 20.8*  PLT 343  --   --  246  --   --  255 291  CRP  --   --   --   --   --  23.1* 25.3* 27.0*  DDIMER  --   --   --   --   --  1.66* 1.84*  --   PROCALCITON  --   --   --  2.32  --   --   --   --   AST 56*  --   --  50*  --   --  52* 56*  ALT 27  --   --  23  --   --  21 26  ALKPHOS 76  --   --  63  --   --  65 62  BILITOT 0.8  --   --  1.3*  --   --  0.4 0.5  ALBUMIN 2.9*  --   --  2.3*  --   --  1.9* 1.7*  INR 1.0  --   --  1.0  --   --   --   --   LATICACIDVEN 3.1*  --  10.3* 1.3 1.1  --   --   --   SARSCOV2NAA  --  POSITIVE*  --   --   --   --   --   --  ABG     Component Value Date/Time   PHART 7.317 (L) 10/22/2015 0709   PCO2ART 32.3 (L) 10/22/2015 0709   PO2ART 83.0 10/22/2015 0709   HCO3 29.3 (H) 01/26/2016 2048   TCO2 31 01/26/2016 2048   ACIDBASEDEF 8.0 (H) 10/22/2015 0709   O2SAT 61.0 01/26/2016 2048          Condition - Extremely Guarded  Family Communication  : Son by phone 08/14/2020  Code Status : NR/comfort care  Consults  : Palliative  medicine   Status is: Inpatient  Remains inpatient appropriate because:IV treatments appropriate due to intensity of illness or inability to take PO   Dispo: The patient is from: Home              Anticipated d/c is to: Expected hospital death              Anticipated d/c date is: 2 days              Patient currently is not medically stable to d/c.      DVT Prophylaxis  : Comfort   Lab Results  Component Value Date   PLT 291 08/13/2020    Diet :  Diet Order            DIET - DYS 1 Room service appropriate? No; Fluid consistency: Nectar Thick  Diet effective now                  Inpatient Medications  Scheduled Meds: . antiseptic oral  rinse  15 mL Topical BID  .  morphine injection  2 mg Intravenous Q4H   Continuous Infusions: PRN Meds:.[DISCONTINUED] acetaminophen **OR** acetaminophen (TYLENOL) oral liquid 160 mg/5 mL **OR** acetaminophen, albuterol, glycopyrrolate **OR** glycopyrrolate **OR** glycopyrrolate, haloperidol **OR** haloperidol **OR** haloperidol lactate, LORazepam **OR** LORazepam **OR** LORazepam, morphine injection, ondansetron **OR** ondansetron (ZOFRAN) IV, polyvinyl alcohol  Antibiotics  :    Anti-infectives (From admission, onward)   Start     Dose/Rate Route Frequency Ordered Stop   08/12/20 1000  remdesivir 100 mg in sodium chloride 0.9 % 100 mL IVPB  Status:  Discontinued       "Followed by" Linked Group Details   100 mg 200 mL/hr over 30 Minutes Intravenous Daily 08/11/20 0900 08/13/20 1439   08/11/20 2200  cefTRIAXone (ROCEPHIN) 2 g in sodium chloride 0.9 % 100 mL IVPB  Status:  Discontinued        2 g 200 mL/hr over 30 Minutes Intravenous Every 24 hours 08/11/20 0536 08/13/20 1439   08/11/20 0845  remdesivir 200 mg in sodium chloride 0.9% 250 mL IVPB       "Followed by" Linked Group Details   200 mg 580 mL/hr over 30 Minutes Intravenous Once 08/11/20 0900 08/11/20 1123   08/11/20 0800  azithromycin (ZITHROMAX) 500 mg in sodium chloride 0.9 % 250 mL IVPB  Status:  Discontinued        500 mg 250 mL/hr over 60 Minutes Intravenous Every 24 hours 08/11/20 0536 08/13/20 1439   08/11/20 0315  ceFEPIme (MAXIPIME) 2 g in sodium chloride 0.9 % 100 mL IVPB        2 g 200 mL/hr over 30 Minutes Intravenous  Once 08/11/20 0310 08/11/20 0416   08/11/20 0315  vancomycin (VANCOCIN) IVPB 1000 mg/200 mL premix        1,000 mg 200 mL/hr over 60 Minutes Intravenous  Once 08/11/20 0310 08/11/20 0445   08/11/20 0315  piperacillin-tazobactam (ZOSYN) IVPB 4.5 g  Status:  Discontinued        4.5 g 200 mL/hr over 30 Minutes Intravenous  Once 08/11/20 0310 08/11/20 0316        Mliss Fritz Erland Vivas M.D on 08/15/2020  at 1:16 PM  To page go to [email or web address]   [facility] -  Office  [phone]   ils    Objective:   Vitals:   08/13/20 0910 08/13/20 1047 08/13/20 1257 08/13/20 2000  BP:  123/70 126/77 (!) 144/88  Pulse:  83 91 92  Resp: (!) 29  Temp: 97.9 F (36.6 C) 97.7 F (36.5 C) 98.5 F (36.9 C) (!) 97.5 F (36.4 C)  TempSrc: Oral Oral Oral Oral  SpO2: 100%   93%  Weight:      Height:        Wt Readings from Last 3 Encounters:  08/11/20 54.4 kg  10/26/15 87.5 kg  08/16/14 80.7 kg     Intake/Output Summary (Last 24 hours) at 08/15/2020 1316 Last data filed at 08/15/2020 0936 Gross per 24 hour  Intake --  Output 700 ml  Net -700 ml     Physical Exam  [place] intermittently, but overall he is in deep sleep, minimally responsive to verbal stimuli, but  he is not interactive.  Extremely frail, deconditioned and cachectic  Diminished air entry bilaterally Irregular irregular Abdomen soft, nontender, bowel sounds present, scaphoid Extremity with no edema or cyanosis    Data Review:    CBC Recent Labs  Lab 25-Aug-2020 2036 08/11/20 0550 08/12/20 0428 08/13/20 0514  WBC 10.5 16.0* 20.4* 20.8*  HGB 12.3* 10.9* 10.9* 11.3*  HCT 40.0 34.7* 35.7* 36.4*  PLT 343 246 255 291  MCV 87.9 87.2 89.7 88.3  MCH 27.0 27.4 27.4 27.4  MCHC 30.8 31.4 30.5 31.0  RDW 14.3 14.5 14.7 14.6  LYMPHSABS 0.7 0.4* 0.0* 0.5*  MONOABS 0.5 0.5 0.0* 0.7  EOSABS 0.0 0.0 0.0 0.0  BASOSABS 0.0 0.0 0.0 0.0    Recent Labs  Lab 25-Aug-2020 2036 08/11/20 0520 08/11/20 0550 08/11/20 0759 08/11/20 1217 08/12/20 0428 08/13/20 0514  NA 140  --  139  --   --  144 144  K 3.5  --  3.2*  --   --  3.1* 2.7*  CL 88*  --  92*  --   --  97* 98  CO2 30  --  30  --   --  34* 38*  GLUCOSE 98  --  95  --   --  191* 128*  BUN 27*  --  30*  --   --  25* 22  CREATININE 1.26*  --  1.45*  --   --  1.06 0.86  CALCIUM 8.6*  --  7.9*  --   --  7.8* 7.8*  AST 56*  --  50*  --   --  52* 56*   ALT 27  --  23  --   --  21 26  ALKPHOS 76  --  63  --   --  65 62  BILITOT 0.8  --  1.3*  --   --  0.4 0.5  ALBUMIN 2.9*  --  2.3*  --   --  1.9* 1.7*  CRP  --   --   --   --  23.1* 25.3* 27.0*  DDIMER  --   --   --   --  1.66* 1.84*  --   PROCALCITON  --   --  2.32  --   --   --   --  LATICACIDVEN 3.1* 10.3* 1.3 1.1  --   --   --   INR 1.0  --  1.0  --   --   --   --   HGBA1C  --   --  5.9*  --   --   --   --     ------------------------------------------------------------------------------------------------------------------ No results for input(s): CHOL, HDL, LDLCALC, TRIG, CHOLHDL, LDLDIRECT in the last 72 hours.  Lab Results  Component Value Date   HGBA1C 5.9 (H) 08/11/2020   ------------------------------------------------------------------------------------------------------------------ No results for input(s): TSH, T4TOTAL, T3FREE, THYROIDAB in the last 72 hours.  Invalid input(s): FREET3  Cardiac Enzymes No results for input(s): CKMB, TROPONINI, MYOGLOBIN in the last 168 hours.  Invalid input(s): CK ------------------------------------------------------------------------------------------------------------------    Component Value Date/Time   BNP 19.4 01/26/2016 2030    Micro Results Recent Results (from the past 240 hour(s))  Culture, blood (Routine x 2)     Status: None   Collection Time: 08/14/2020  8:15 PM   Specimen: BLOOD  Result Value Ref Range Status   Specimen Description BLOOD RIGHT ARM  Final   Special Requests   Final    BOTTLES DRAWN AEROBIC AND ANAEROBIC Blood Culture adequate volume   Culture   Final    NO GROWTH 5 DAYS Performed at [facility], [address], [place], [place] [address]    Report Status 08/15/2020 FINAL  Final  Blood Culture ID Panel (Reflexed)     Status: None   Collection Time: 14-Aug-2020  8:36 PM  Result Value Ref Range Status   Enterococcus faecalis NOT DETECTED NOT DETECTED Final   Enterococcus Faecium NOT  DETECTED NOT DETECTED Final   Listeria monocytogenes NOT DETECTED NOT DETECTED Final   Staphylococcus species NOT DETECTED NOT DETECTED Final   Staphylococcus aureus (BCID) NOT DETECTED NOT DETECTED Final   Staphylococcus epidermidis NOT DETECTED NOT DETECTED Final   Staphylococcus lugdunensis NOT DETECTED NOT DETECTED Final   Streptococcus species NOT DETECTED NOT DETECTED Final   Streptococcus agalactiae NOT DETECTED NOT DETECTED Final   Streptococcus pneumoniae NOT DETECTED NOT DETECTED Final   Streptococcus pyogenes NOT DETECTED NOT DETECTED Final   A.calcoaceticus-baumannii NOT DETECTED NOT DETECTED Final   Bacteroides fragilis NOT DETECTED NOT DETECTED Final   Enterobacterales NOT DETECTED NOT DETECTED Final   Enterobacter cloacae complex NOT DETECTED NOT DETECTED Final   Escherichia coli NOT DETECTED NOT DETECTED Final   Klebsiella aerogenes NOT DETECTED NOT DETECTED Final   Klebsiella oxytoca NOT DETECTED NOT DETECTED Final   Klebsiella pneumoniae NOT DETECTED NOT DETECTED Final   Proteus species NOT DETECTED NOT DETECTED Final   Salmonella species NOT DETECTED NOT DETECTED Final   Serratia marcescens NOT DETECTED NOT DETECTED Final   Haemophilus influenzae NOT DETECTED NOT DETECTED Final   Neisseria meningitidis NOT DETECTED NOT DETECTED Final   Pseudomonas aeruginosa NOT DETECTED NOT DETECTED Final   Stenotrophomonas maltophilia NOT DETECTED NOT DETECTED Final   Candida albicans NOT DETECTED NOT DETECTED Final   Candida auris NOT DETECTED NOT DETECTED Final   Candida glabrata NOT DETECTED NOT DETECTED Final   Candida krusei NOT DETECTED NOT DETECTED Final   Candida parapsilosis NOT DETECTED NOT DETECTED Final   Candida tropicalis NOT DETECTED NOT DETECTED Final   Cryptococcus neoformans/gattii NOT DETECTED NOT DETECTED Final    Comment: Performed at [facility], [address] [place], [place] [address]  Culture, blood (Routine x 2)     Status: Abnormal    Collection  Time: 08/14/2020  8:38 PM   Specimen: BLOOD  Result Value Ref Range Status   Specimen Description BLOOD LEFT ARM  Final   Special Requests   Final    BOTTLES DRAWN AEROBIC ONLY Blood Culture results may not be optimal due to an inadequate volume of blood received in culture bottles   Culture  Setup Time (A)  Final    GRAM VARIABLE COCCI AEROBIC BOTTLE ONLY CRITICAL RESULT CALLED TO, READ BACK BY AND VERIFIED WITH: PHARMD J EAKINS AT 2027 08/11/20 BY L BENFIELD    Culture (A)  Final    NEISSERIA SPECIES Standardized susceptibility testing for this organism is not available. Performed at [facility], [address] [place], [place] [address]    Report Status 08/14/2020 FINAL  Final  Resp Panel by RT-PCR (Flu A&B, Covid) Nasopharyngeal Swab     Status: Abnormal   Collection Time: 08/11/20  5:13 AM   Specimen: Nasopharyngeal Swab; Nasopharyngeal(NP) swabs in vial transport medium  Result Value Ref Range Status   SARS Coronavirus 2 by RT PCR POSITIVE (A) NEGATIVE Final    Comment: RESULT CALLED TO, READ BACK BY AND VERIFIED WITH: AMarga Hoots RN, AT 4163 08/11/20 BY D. VANHOOK (NOTE) SARS-CoV-2 target nucleic acids are DETECTED.  The SARS-CoV-2 RNA is generally detectable in upper respiratory specimens during the acute phase of infection. Positive results are indicative of the presence of the identified virus, but do not rule out bacterial infection or co-infection with other pathogens not detected by the test. Clinical correlation with patient history and other diagnostic information is necessary to determine patient infection status. The expected result is Negative.  Fact Sheet for Patients: BloggerCourse.com  Fact Sheet for Healthcare Providers: SeriousBroker.it  This test is not yet approved or cleared by the [place] FDA and  has been authorized for detection and/or diagnosis of SARS-CoV-2 by FDA under an  Emergency Use Authorization (EUA).  This EUA will remain in effect (meaning this  test can be used) for the duration of  the COVID-19 declaration under Section 564(b)(1) of the Act, 21 U.S.C. section 360bbb-3(b)(1), unless the authorization is terminated or revoked sooner.     Influenza A by PCR NEGATIVE NEGATIVE Final   Influenza B by PCR NEGATIVE NEGATIVE Final    Comment: (NOTE) The Xpert Xpress SARS-CoV-2/FLU/RSV plus assay is intended as an aid in the diagnosis of influenza from Nasopharyngeal swab specimens and should not be used as a sole basis for treatment. Nasal washings and aspirates are unacceptable for Xpert Xpress SARS-CoV-2/FLU/RSV testing.  Fact Sheet for Patients: BloggerCourse.com  Fact Sheet for Healthcare Providers: SeriousBroker.it  This test is not yet approved or cleared by the [place] FDA and has been authorized for detection and/or diagnosis of SARS-CoV-2 by FDA under an Emergency Use Authorization (EUA). This EUA will remain in effect (meaning this test can be used) for the duration of the COVID-19 declaration under Section 564(b)(1) of the Act, 21 U.S.C. section 360bbb-3(b)(1), unless the authorization is terminated or revoked.  Performed at [facility], [address] [place], [place] [address]     Radiology Reports DG Chest 2 View  Result Date: 07/28/2020 CLINICAL DATA:  Sepsis altered EXAM: CHEST - 2 VIEW COMPARISON:  01/26/2016 FINDINGS: Hyperinflation. Diffuse airspace disease in the left thorax suspicious for acute pneumonia. Stable cardiomediastinal silhouette with aortic atherosclerosis. No pneumothorax IMPRESSION: Diffuse airspace disease in the left thorax suspicious for acute pneumonia. Electronically Signed   By: Selena Batten  Jake Samples M.D.   On: Aug 15, 2020 21:02   CT Head Wo Contrast  Result Date: 08/11/2020 CLINICAL DATA:  Encephalopathy EXAM: CT HEAD WITHOUT CONTRAST TECHNIQUE:  Contiguous axial images were obtained from the base of the skull through the vertex without intravenous contrast. COMPARISON:  None. FINDINGS: Brain: There is hypoattenuation within the left brainstem. Diffuse generalized atrophy with periventricular white matter hypoattenuation. No acute hemorrhage. Vascular: Atherosclerotic calcification of the internal carotid arteries at the skull base. No abnormal hyperdensity of the major intracranial arteries or dural venous sinuses. Skull: The visualized skull base, calvarium and extracranial soft tissues are normal. Sinuses/Orbits: Partial opacification of the left maxillary sinus. The orbits are normal. IMPRESSION: 1. Hypoattenuation within the left brainstem, consistent with acute or subacute infarct. 2. No acute hemorrhage or mass effect. Electronically Signed   By: Deatra Robinson M.D.   On: 08/11/2020 04:49   MR BRAIN WO CONTRAST  Result Date: 08/11/2020 CLINICAL DATA:  Neuro deficit, acute stroke suspected. EXAM: MRI HEAD WITHOUT CONTRAST TECHNIQUE: Multiplanar, multiecho pulse sequences of the brain and surrounding structures were obtained without intravenous contrast. COMPARISON:  CT head from the same day. FINDINGS: Brain: No acute infarction, acute hemorrhage, hydrocephalus, extra-axial collection or mass lesion. Patchy T2/FLAIR hyperintensities within the white matter, compatible with chronic microvascular ischemic disease. Moderate generalized cerebral volume loss with ex vacuo ventricular dilation. Vascular: Major arterial flow voids are maintained at the skull base. Skull and upper cervical spine: Normal marrow signal. Cervical degenerative change. Sinuses/Orbits: Scattered paranasal sinus mucosal thickening with air-fluid levels and frothy secretions in the left maxillary sinus and right sphenoid sinus. Other: Small bilateral mastoid effusions. IMPRESSION: 1. No evidence of acute intracranial abnormality. Specifically, no acute infarct. 2. Chronic  microvascular ischemic disease and generalized cerebral volume loss. 3. Scattered paranasal sinus mucosal thickening with air-fluid levels and frothy secretions in the left maxillary sinus and right sphenoid sinus. Findings could reflect sinusitis in the appropriate clinical setting. Electronically Signed   By: Feliberto Harts MD   On: 08/11/2020 07:08   CT Abdomen Pelvis W Contrast  Result Date: 08/11/2020 CLINICAL DATA:  Sepsis.  Suspected abdominal infection or abscess. EXAM: CT ABDOMEN AND PELVIS WITH CONTRAST TECHNIQUE: Multidetector CT imaging of the abdomen and pelvis was performed using the standard protocol following bolus administration of intravenous contrast. CONTRAST:  OMNIPAQUE IOHEXOL 300 MG/ML  SOLN COMPARISON:  Noncontrast CT on 09/23/2010 FINDINGS: Lower Chest: New airspace disease is seen in the peripheral left lower lobe with tiny left pleural effusion. Emphysema also noted. Hepatobiliary: Probable tiny hepatic cysts noted. Postop changes from previous right hepatectomy and cholecystectomy. No definite liver masses identified. No evidence of biliary ductal dilatation. Pancreas:  No mass or inflammatory changes. Spleen: Within normal limits in size and appearance. Adrenals/Urinary Tract: Stable nodularity of left adrenal gland. A few tiny renal cysts are seen bilaterally. No masses identified. A few tiny 2-3 mm calculi are noted in the left kidney. No evidence of ureteral calculi or hydronephrosis. A few tiny 1-2 mm calculi are also seen in the urinary bladder. Diffuse bladder wall thickening is seen, likely due to chronic bladder outlet obstruction given enlarged prostate. Stomach/Bowel: No evidence of obstruction, inflammatory process or abnormal fluid collections. Vascular/Lymphatic: No pathologically enlarged lymph nodes. No abdominal aortic aneurysm. Aortic atherosclerotic calcification noted. Reproductive:  Mildly enlarged prostate gland again noted. Other:  None.  Musculoskeletal:  No suspicious bone lesions identified. IMPRESSION: New airspace disease in peripheral left lower lobe with tiny left pleural effusion, suspicious  for pneumonia. No abscess or other acute findings within the abdomen or pelvis. Tiny nonobstructing left renal and urinary bladder calculi. No evidence of ureteral calculi or hydronephrosis. Mildly enlarged prostate and findings of chronic bladder outlet obstruction. Aortic Atherosclerosis (ICD10-I70.0) and Emphysema (ICD10-J43.9). Electronically Signed   By: Danae Orleans M.D.   On: 08/11/2020 04:50

## 2020-08-15 NOTE — Care Management Important Message (Signed)
Important Message  Patient Details  Name: Alejandro Hall MRN: [id number] Date of Birth: November 13, 1928   Medicare Important Message Given:  Yes - Important Message mailed due to current National Emergency  Verbal consent obtained due to current National Emergency  Relationship to patient: Self Contact Name: Currie Dennin Call Date: 08/15/20  Time: 1607 Phone: [phone] Outcome: No Answer/Busy Important Message mailed to: Patient address on file    [name] 08/15/2020, 4:07 PM

## 2020-08-15 NOTE — Plan of Care (Signed)
  Problem: Pain Managment: Goal: General experience of comfort will improve Outcome: Progressing   Problem: Skin Integrity: Goal: Risk for impaired skin integrity will decrease Outcome: Progressing   

## 2020-08-15 NOTE — Progress Notes (Signed)
Palliative Medicine RN Note: Symptom check.  Patient is not comfortable. HR well over 100 maintained and RR in the 30s despite scheduled morphine; has not gotten any prn doses. Obtained orders to start a very aggressive comfort regimen d/t COVID diagnosis. Confirmed that pt has IV access with RN  Margret Chance. Margareth Kanner, RN, BSN, [facility] Palliative Medicine Team 08/15/2020 1:36 PM Office [phone]

## 2020-08-16 DIAGNOSIS — G9349 Other encephalopathy: Secondary | ICD-10-CM

## 2020-08-16 DIAGNOSIS — U071 COVID-19: Secondary | ICD-10-CM

## 2020-08-25 NOTE — Death Summary Note (Signed)
DEATH SUMMARY   Patient Details  Name: Alejandro Hall MRN: [id number] DOB: 06/15/1929  Admission/Discharge Information   Admit Date:  08/14/20  Date of Death: Date of Death: Aug 19, 2020  Time of Death: Time of Death: 12/12/2148  Length of Stay: 5  Referring Physician: Kirby Funk, MD   Reason(s) for Hospitalization   Difficulty walking, confusion and shortness of breath  Diagnoses  Preliminary cause of death:   -Acute hypoxic respiratory failure due to bacterial pneumonia and COVID-19 of pneumonia  Secondary Diagnoses (including complications and co-morbidities):  Principal Problem:   Sepsis (HCC) Active Problems:   Encephalopathy due to COVID-19 virus   Acute respiratory failure with hypoxia (HCC)   HTN (hypertension)   Diabetes mellitus type 2, uncomplicated (HCC)   Pulmonary hypertension (HCC)   Severe sepsis (HCC)   Pressure injury of skin   Hypokalemia   Hypoglycemia   Severe protein-calorie malnutrition (HCC)   Failure to thrive in adult   Unspecified atrial fibrillation (HCC)   Acute metabolic encephalopathy   Acute respiratory disease due to COVID-19 virus   Brief Hospital Course (including significant findings, care, treatment, and services provided and events leading to death)  Alejandro Hall is a 87 y.o. year old male who  who lives at home with his son, [name]. He carries a past medical history significant for arthritis, COPD, DM II, hypertension, seizure disorder, and history of kidney stones.  The patient was brought to [facility] on 14-Aug-2020 by his son for increasing confusion, inability to ambulate on the advise of the patient's PCP.   In the ED the patient was oriented to name only. He was found to be hypoxic and was able to move all extremities. The patient was discussed with Dr. Otelia Limes. Although CT head was suggestive of acute vs subacute stroke, MRI brain was negative for CVA. Dr. Otelia Limes did not feel that the patient's presentation was consistent with CVA.  CXR demonstrated diffuse airspace disease in the left thorax suspicious for acute pneumonia. The patient was started on IV cefepime and azithromycin. COVID 19 testing came back positive.  The patient has been evaluated by SLP, and he has been found incapable of managing his own secretions. He is now NPO. Blood sugars are supported with D10.  The patient has been placed on the COVID-19 protocol.  Was treated with IV steroids and Remdesivir, no baricitinib/Actemra given elevated procalcitonin, there was high clinical suspicion of bacterial of pneumonia as well, which was covered with appropriate antibiotics,  his respiratory and mental status have declined since presentation.  Remains quite encephalopathic, frail and deconditioned, unable to pass swallow evaluation and tolerate an oral intake, he did require D10 W / D5 half-normal saline, as well he is with severe protein calorie malnutrition, he remains quite encephalopathic in the setting of sepsis, and COVID-19 encephalopathy, as well he did develop A. fib during hospital stay, he is DNR, and with poor prognosis, palliative medicine has been consulted, family did not want any heroics, or aggressive management, he was kept on minute management for 48 hours, without improvement, so patient was transitioned to comfort measures after discussing with palliative medicine, and was started on comfort measures protocol, patient passed away on 2020-08-19.      Pertinent Labs and Studies  Significant Diagnostic Studies DG Chest 2 View  Result Date: 08/14/2020 CLINICAL DATA:  Sepsis altered EXAM: CHEST - 2 VIEW COMPARISON:  01/26/2016 FINDINGS: Hyperinflation. Diffuse airspace disease in the left thorax suspicious for acute pneumonia. Stable cardiomediastinal silhouette  with aortic atherosclerosis. No pneumothorax IMPRESSION: Diffuse airspace disease in the left thorax suspicious for acute pneumonia. Electronically Signed   By: Jasmine PangKim  Fujinaga M.D.   On:  08/18/2020 21:02   CT Head Wo Contrast  Result Date: 08/11/2020 CLINICAL DATA:  Encephalopathy EXAM: CT HEAD WITHOUT CONTRAST TECHNIQUE: Contiguous axial images were obtained from the base of the skull through the vertex without intravenous contrast. COMPARISON:  None. FINDINGS: Brain: There is hypoattenuation within the left brainstem. Diffuse generalized atrophy with periventricular white matter hypoattenuation. No acute hemorrhage. Vascular: Atherosclerotic calcification of the internal carotid arteries at the skull base. No abnormal hyperdensity of the major intracranial arteries or dural venous sinuses. Skull: The visualized skull base, calvarium and extracranial soft tissues are normal. Sinuses/Orbits: Partial opacification of the left maxillary sinus. The orbits are normal. IMPRESSION: 1. Hypoattenuation within the left brainstem, consistent with acute or subacute infarct. 2. No acute hemorrhage or mass effect. Electronically Signed   By: Deatra RobinsonKevin  Herman M.D.   On: 08/11/2020 04:49   MR BRAIN WO CONTRAST  Result Date: 08/11/2020 CLINICAL DATA:  Neuro deficit, acute stroke suspected. EXAM: MRI HEAD WITHOUT CONTRAST TECHNIQUE: Multiplanar, multiecho pulse sequences of the brain and surrounding structures were obtained without intravenous contrast. COMPARISON:  CT head from the same day. FINDINGS: Brain: No acute infarction, acute hemorrhage, hydrocephalus, extra-axial collection or mass lesion. Patchy T2/FLAIR hyperintensities within the white matter, compatible with chronic microvascular ischemic disease. Moderate generalized cerebral volume loss with ex vacuo ventricular dilation. Vascular: Major arterial flow voids are maintained at the skull base. Skull and upper cervical spine: Normal marrow signal. Cervical degenerative change. Sinuses/Orbits: Scattered paranasal sinus mucosal thickening with air-fluid levels and frothy secretions in the left maxillary sinus and right sphenoid sinus. Other: Small  bilateral mastoid effusions. IMPRESSION: 1. No evidence of acute intracranial abnormality. Specifically, no acute infarct. 2. Chronic microvascular ischemic disease and generalized cerebral volume loss. 3. Scattered paranasal sinus mucosal thickening with air-fluid levels and frothy secretions in the left maxillary sinus and right sphenoid sinus. Findings could reflect sinusitis in the appropriate clinical setting. Electronically Signed   By: Feliberto HartsFrederick S Jones MD   On: 08/11/2020 07:08   CT Abdomen Pelvis W Contrast  Result Date: 08/11/2020 CLINICAL DATA:  Sepsis.  Suspected abdominal infection or abscess. EXAM: CT ABDOMEN AND PELVIS WITH CONTRAST TECHNIQUE: Multidetector CT imaging of the abdomen and pelvis was performed using the standard protocol following bolus administration of intravenous contrast. CONTRAST:  100mL OMNIPAQUE IOHEXOL 300 MG/ML  SOLN COMPARISON:  Noncontrast CT on 09/23/2010 FINDINGS: Lower Chest: New airspace disease is seen in the peripheral left lower lobe with tiny left pleural effusion. Emphysema also noted. Hepatobiliary: Probable tiny hepatic cysts noted. Postop changes from previous right hepatectomy and cholecystectomy. No definite liver masses identified. No evidence of biliary ductal dilatation. Pancreas:  No mass or inflammatory changes. Spleen: Within normal limits in size and appearance. Adrenals/Urinary Tract: Stable nodularity of left adrenal gland. A few tiny renal cysts are seen bilaterally. No masses identified. A few tiny 2-3 mm calculi are noted in the left kidney. No evidence of ureteral calculi or hydronephrosis. A few tiny 1-2 mm calculi are also seen in the urinary bladder. Diffuse bladder wall thickening is seen, likely due to chronic bladder outlet obstruction given enlarged prostate. Stomach/Bowel: No evidence of obstruction, inflammatory process or abnormal fluid collections. Vascular/Lymphatic: No pathologically enlarged lymph nodes. No abdominal aortic  aneurysm. Aortic atherosclerotic calcification noted. Reproductive:  Mildly enlarged prostate gland again noted. Other:  None. Musculoskeletal:  No suspicious bone lesions identified. IMPRESSION: New airspace disease in peripheral left lower lobe with tiny left pleural effusion, suspicious for pneumonia. No abscess or other acute findings within the abdomen or pelvis. Tiny nonobstructing left renal and urinary bladder calculi. No evidence of ureteral calculi or hydronephrosis. Mildly enlarged prostate and findings of chronic bladder outlet obstruction. Aortic Atherosclerosis (ICD10-I70.0) and Emphysema (ICD10-J43.9). Electronically Signed   By: Danae Orleans M.D.   On: 08/11/2020 04:50    Microbiology Recent Results (from the past 240 hour(s))  Culture, blood (Routine x 2)     Status: None   Collection Time: 08/03/2020  8:15 PM   Specimen: BLOOD  Result Value Ref Range Status   Specimen Description BLOOD RIGHT ARM  Final   Special Requests   Final    BOTTLES DRAWN AEROBIC AND ANAEROBIC Blood Culture adequate volume   Culture   Final    NO GROWTH 5 DAYS Performed at [facility], [address] [place], [place] [address]    Report Status 08/15/2020 FINAL  Final  Blood Culture ID Panel (Reflexed)     Status: None   Collection Time: 08/22/2020  8:36 PM  Result Value Ref Range Status   Enterococcus faecalis NOT DETECTED NOT DETECTED Final   Enterococcus Faecium NOT DETECTED NOT DETECTED Final   Listeria monocytogenes NOT DETECTED NOT DETECTED Final   Staphylococcus species NOT DETECTED NOT DETECTED Final   Staphylococcus aureus (BCID) NOT DETECTED NOT DETECTED Final   Staphylococcus epidermidis NOT DETECTED NOT DETECTED Final   Staphylococcus lugdunensis NOT DETECTED NOT DETECTED Final   Streptococcus species NOT DETECTED NOT DETECTED Final   Streptococcus agalactiae NOT DETECTED NOT DETECTED Final   Streptococcus pneumoniae NOT DETECTED NOT DETECTED Final   Streptococcus pyogenes NOT  DETECTED NOT DETECTED Final   A.calcoaceticus-baumannii NOT DETECTED NOT DETECTED Final   Bacteroides fragilis NOT DETECTED NOT DETECTED Final   Enterobacterales NOT DETECTED NOT DETECTED Final   Enterobacter cloacae complex NOT DETECTED NOT DETECTED Final   Escherichia coli NOT DETECTED NOT DETECTED Final   Klebsiella aerogenes NOT DETECTED NOT DETECTED Final   Klebsiella oxytoca NOT DETECTED NOT DETECTED Final   Klebsiella pneumoniae NOT DETECTED NOT DETECTED Final   Proteus species NOT DETECTED NOT DETECTED Final   Salmonella species NOT DETECTED NOT DETECTED Final   Serratia marcescens NOT DETECTED NOT DETECTED Final   Haemophilus influenzae NOT DETECTED NOT DETECTED Final   Neisseria meningitidis NOT DETECTED NOT DETECTED Final   Pseudomonas aeruginosa NOT DETECTED NOT DETECTED Final   Stenotrophomonas maltophilia NOT DETECTED NOT DETECTED Final   Candida albicans NOT DETECTED NOT DETECTED Final   Candida auris NOT DETECTED NOT DETECTED Final   Candida glabrata NOT DETECTED NOT DETECTED Final   Candida krusei NOT DETECTED NOT DETECTED Final   Candida parapsilosis NOT DETECTED NOT DETECTED Final   Candida tropicalis NOT DETECTED NOT DETECTED Final   Cryptococcus neoformans/gattii NOT DETECTED NOT DETECTED Final    Comment: Performed at [facility], [address] [place], [place] [address]  Culture, blood (Routine x 2)     Status: Abnormal   Collection Time: 08/09/2020  8:38 PM   Specimen: BLOOD  Result Value Ref Range Status   Specimen Description BLOOD LEFT ARM  Final   Special Requests   Final    BOTTLES DRAWN AEROBIC ONLY Blood Culture results may not be optimal due to an inadequate volume of blood received in culture bottles   Culture  Setup Time (A)  Final    GRAM VARIABLE COCCI AEROBIC BOTTLE ONLY CRITICAL RESULT CALLED TO, READ BACK BY AND VERIFIED WITH: PHARMD J EAKINS AT 2027 08/11/20 BY L BENFIELD    Culture (A)  Final    NEISSERIA SPECIES Standardized  susceptibility testing for this organism is not available. Performed at [facility], [address], [place], [place] [address]    Report Status 08/14/2020 FINAL  Final  Resp Panel by RT-PCR (Flu A&B, Covid) Nasopharyngeal Swab     Status: Abnormal   Collection Time: 08/11/20  5:13 AM   Specimen: Nasopharyngeal Swab; Nasopharyngeal(NP) swabs in vial transport medium  Result Value Ref Range Status   SARS Coronavirus 2 by RT PCR POSITIVE (A) NEGATIVE Final    Comment: RESULT CALLED TO, READ BACK BY AND VERIFIED WITH: AMarga Hoots RN, AT 9449 08/11/20 BY D. VANHOOK (NOTE) SARS-CoV-2 target nucleic acids are DETECTED.  The SARS-CoV-2 RNA is generally detectable in upper respiratory specimens during the acute phase of infection. Positive results are indicative of the presence of the identified virus, but do not rule out bacterial infection or co-infection with other pathogens not detected by the test. Clinical correlation with patient history and other diagnostic information is necessary to determine patient infection status. The expected result is Negative.  Fact Sheet for Patients: BloggerCourse.com  Fact Sheet for Healthcare Providers: SeriousBroker.it  This test is not yet approved or cleared by the [place] FDA and  has been authorized for detection and/or diagnosis of SARS-CoV-2 by FDA under an Emergency Use Authorization (EUA).  This EUA will remain in effect (meaning this  test can be used) for the duration of  the COVID-19 declaration under Section 564(b)(1) of the Act, 21 U.S.C. section 360bbb-3(b)(1), unless the authorization is terminated or revoked sooner.     Influenza A by PCR NEGATIVE NEGATIVE Final   Influenza B by PCR NEGATIVE NEGATIVE Final    Comment: (NOTE) The Xpert Xpress SARS-CoV-2/FLU/RSV plus assay is intended as an aid in the diagnosis of influenza from Nasopharyngeal swab specimens and should  not be used as a sole basis for treatment. Nasal washings and aspirates are unacceptable for Xpert Xpress SARS-CoV-2/FLU/RSV testing.  Fact Sheet for Patients: BloggerCourse.com  Fact Sheet for Healthcare Providers: SeriousBroker.it  This test is not yet approved or cleared by the [place] FDA and has been authorized for detection and/or diagnosis of SARS-CoV-2 by FDA under an Emergency Use Authorization (EUA). This EUA will remain in effect (meaning this test can be used) for the duration of the COVID-19 declaration under Section 564(b)(1) of the Act, 21 U.S.C. section 360bbb-3(b)(1), unless the authorization is terminated or revoked.  Performed at [facility], [address] [place], [place] [address]     Lab Basic Metabolic Panel: Recent Labs  Lab 08/17/2020 2036 08/11/20 0550 08/12/20 0428 08/13/20 0514  NA 140 139 144 144  K 3.5 3.2* 3.1* 2.7*  CL 88* 92* 97* 98  CO2 30 30 34* 38*  GLUCOSE 98 95 191* 128*  BUN 27* 30* 25* 22  CREATININE 1.26* 1.45* 1.06 0.86  CALCIUM 8.6* 7.9* 7.8* 7.8*   Liver Function Tests: Recent Labs  Lab 08/15/2020 2036 08/11/20 0550 08/12/20 0428 08/13/20 0514  AST 56* 50* 52* 56*  ALT 27 23 21 26   ALKPHOS 76 63 65 62  BILITOT 0.8 1.3* 0.4 0.5  PROT 6.9 5.7* 5.2* 5.1*  ALBUMIN 2.9* 2.3* 1.9* 1.7*   No results for  input(s): LIPASE, AMYLASE in the last 168 hours. No results for input(s): AMMONIA in the last 168 hours. CBC: Recent Labs  Lab 08-21-20 2036 08/11/20 0550 08/12/20 0428 08/13/20 0514  WBC 10.5 16.0* 20.4* 20.8*  NEUTROABS 9.2* 15.1* 20.4* 19.4*  HGB 12.3* 10.9* 10.9* 11.3*  HCT 40.0 34.7* 35.7* 36.4*  MCV 87.9 87.2 89.7 88.3  PLT 343 246 255 291   Cardiac Enzymes: No results for input(s): CKTOTAL, CKMB, CKMBINDEX, TROPONINI in the last 168 hours. Sepsis Labs: Recent Labs  Lab 08/21/20 2036 08/11/20 0520 08/11/20 0550 08/11/20 0759 08/12/20 0428  08/13/20 0514  PROCALCITON  --   --  2.32  --   --   --   WBC 10.5  --  16.0*  --  20.4* 20.8*  LATICACIDVEN 3.1* 10.3* 1.3 1.1  --   --     Procedures/Operations     Kimerly Rowand 07/27/2020, 4:11 PM

## 2020-08-25 NOTE — Progress Notes (Signed)
Patient expired on 08/15/2020 at 2150. Patient's son, [name] Alinda Money) and patient's daughter-in-law at the bedside around 0020. Son [name]) has not selected a funeral home at this time. If son needs to be contacted he asks that it be before 1000 because he works 3rd shift. Son stated that he has a Health Care Power of Attorney [facility]) for his father and he is trying to locate it. No HCPOA was found on the patient's EMR at this time. Patient transported to the morgue this AM after Son came to see patient at the bedside.

## 2020-08-25 DEATH — deceased

## 2022-09-13 IMAGING — CT CT ABD-PELV W/ CM
2 of 5 series · 16 of 46 positions shown, 18 images · IV contrast (Omni 300)
Comparison: Noncontrast CT on 09/23/2010

CLINICAL DATA: Sepsis.  Suspected abdominal infection or abscess.

EXAM:
CT ABDOMEN AND PELVIS WITH CONTRAST
TECHNIQUE: Multidetector CT imaging of the abdomen and pelvis was performed
using the standard protocol following bolus administration of
intravenous contrast.
CONTRAST:  100mL OMNIPAQUE IOHEXOL 300 MG/ML  SOLN

[Series 3: a/p w/ 5mm · axial · 0.62mm/px · z∈[-850,-485]mm · 13 of 83 slices shown, 15 images]
[im 5/83  soft-tissue]
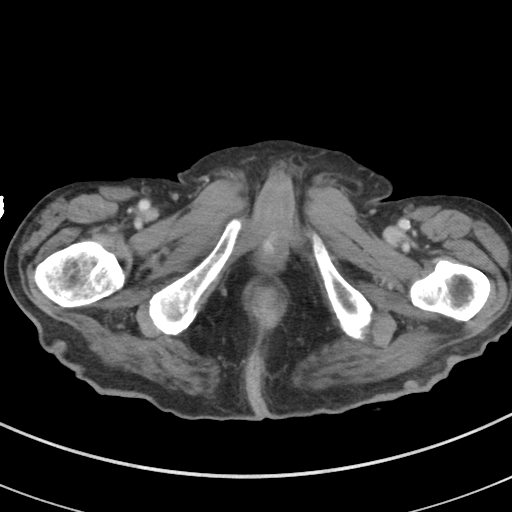
[im 5/83  bone]
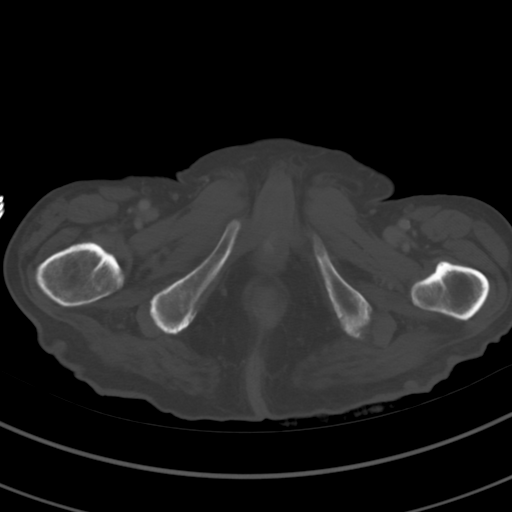
[im 10/83  soft-tissue]
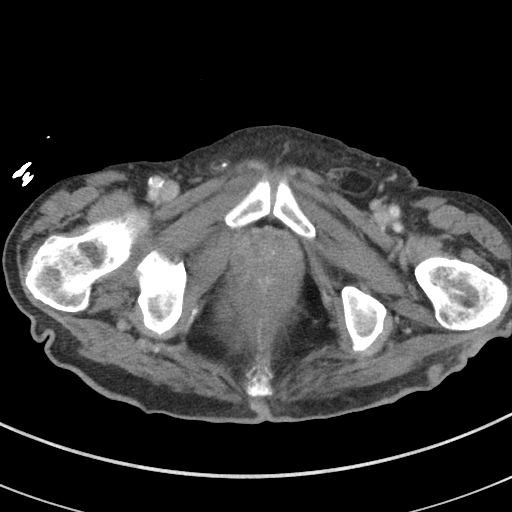
[im 20/83  soft-tissue]
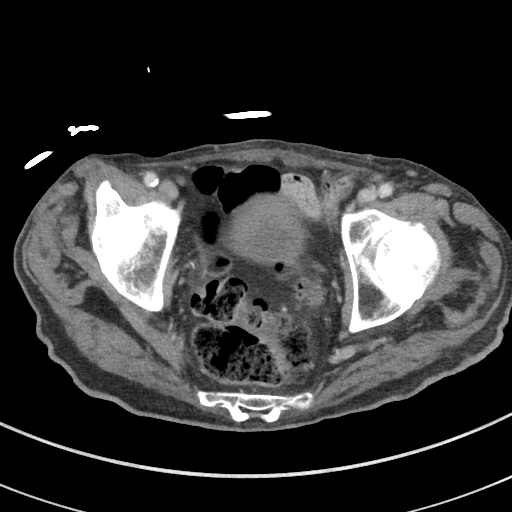
[im 25/83  soft-tissue]
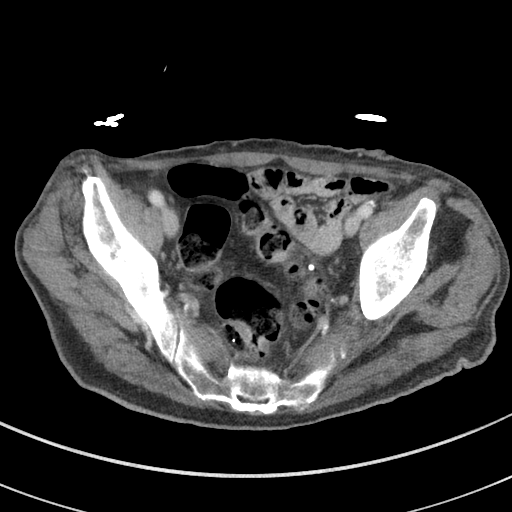
[im 29/83  soft-tissue]
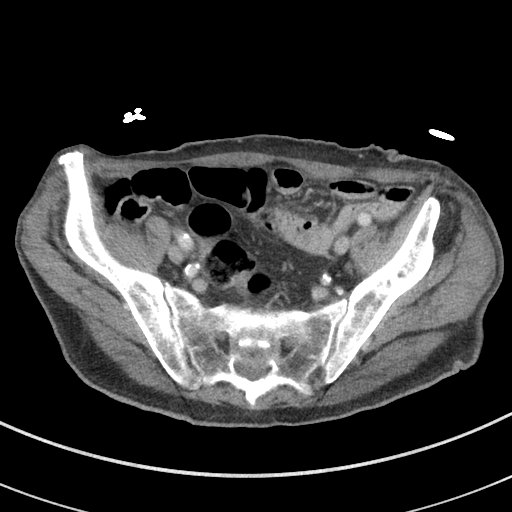
[im 34/83  soft-tissue]
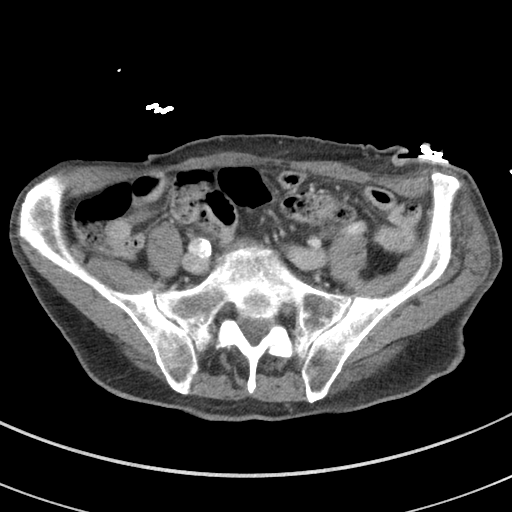
[im 44/83  soft-tissue]
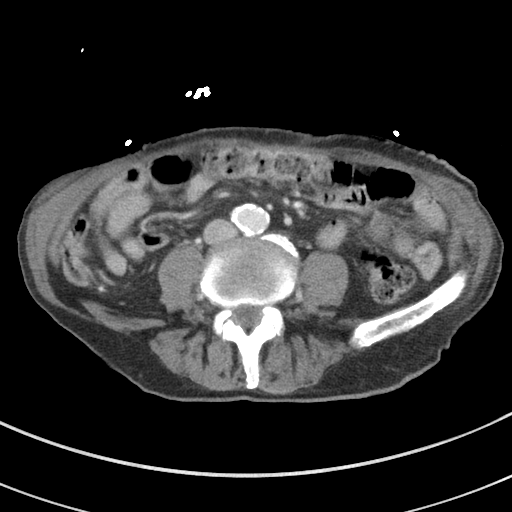
[im 49/83  soft-tissue]
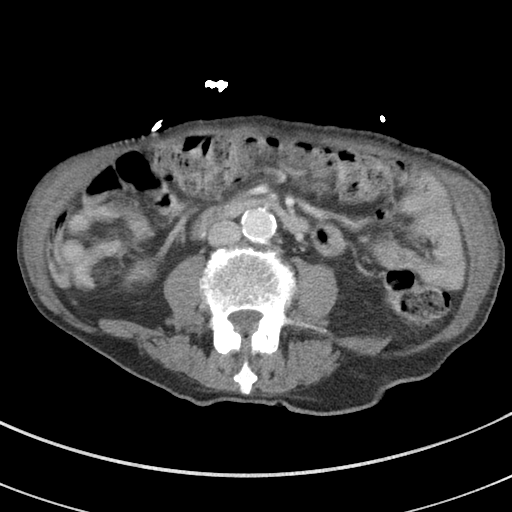
[im 54/83  soft-tissue]
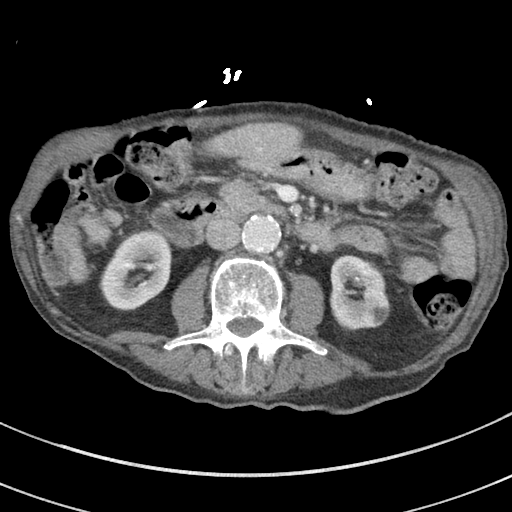
[im 54/83  bone]
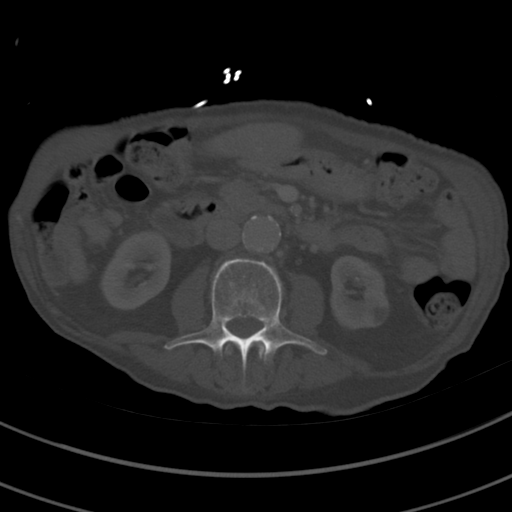
[im 58/83  soft-tissue]
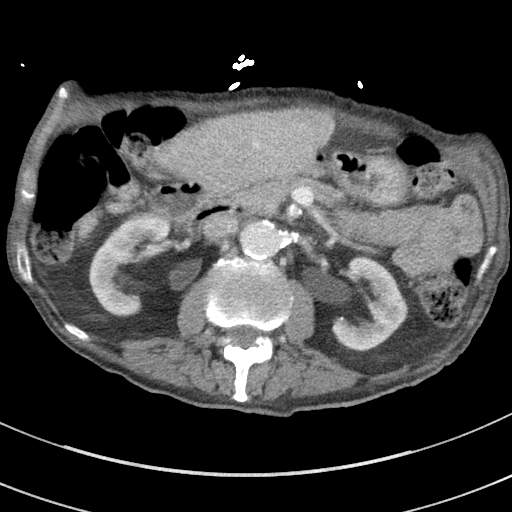
[im 63/83  soft-tissue]
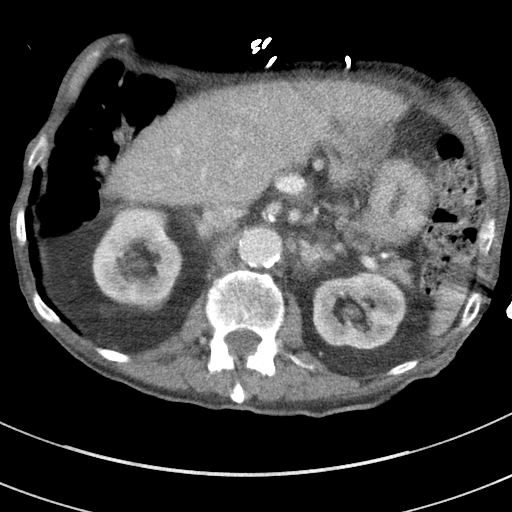
[im 73/83  soft-tissue]
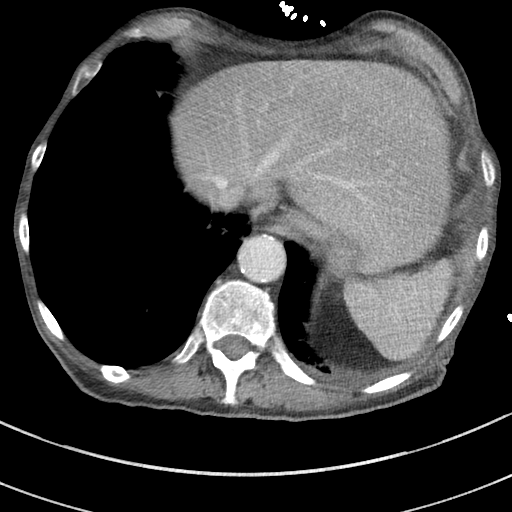
[im 78/83  soft-tissue]
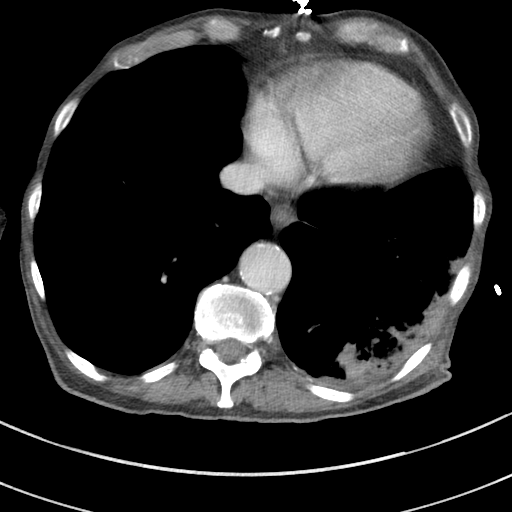

[Series 6: a/p w/ cor · coronal · 0.70mm/px · 3 of 113 slices shown]
[im 38/113  soft-tissue]
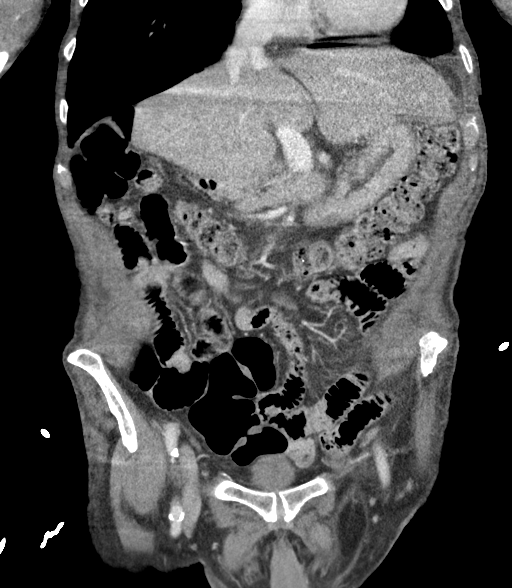
[im 50/113  soft-tissue]
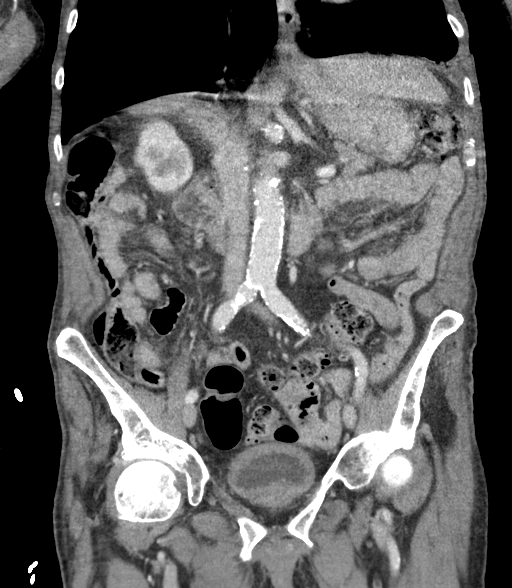
[im 63/113  soft-tissue]
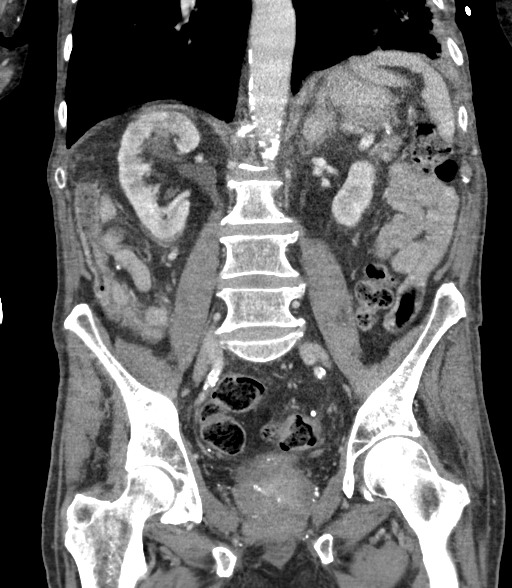

[16 of 46 positions shown; findings below may reference images not displayed]

FINDINGS: Lower Chest: New airspace disease is seen in the peripheral left
lower lobe with tiny left pleural effusion. Emphysema also noted.

Hepatobiliary: Probable tiny hepatic cysts noted. Postop changes
from previous right hepatectomy and cholecystectomy. No definite
liver masses identified. No evidence of biliary ductal dilatation.

Pancreas:  No mass or inflammatory changes.

Spleen: Within normal limits in size and appearance.

Adrenals/Urinary Tract: Stable nodularity of left adrenal gland. A
few tiny renal cysts are seen bilaterally. No masses identified. A
few tiny 2-3 mm calculi are noted in the left kidney. No evidence of
ureteral calculi or hydronephrosis. A few tiny 1-2 mm calculi are
also seen in the urinary bladder. Diffuse bladder wall thickening is
seen, likely due to chronic bladder outlet obstruction given
enlarged prostate.

Stomach/Bowel: No evidence of obstruction, inflammatory process or
abnormal fluid collections.

Vascular/Lymphatic: No pathologically enlarged lymph nodes. No
abdominal aortic aneurysm. Aortic atherosclerotic calcification
noted.

Reproductive:  Mildly enlarged prostate gland again noted.

Other:  None.

Musculoskeletal:  No suspicious bone lesions identified.
IMPRESSION: New airspace disease in peripheral left lower lobe with tiny left
pleural effusion, suspicious for pneumonia.

No abscess or other acute findings within the abdomen or pelvis.

Tiny nonobstructing left renal and urinary bladder calculi. No
evidence of ureteral calculi or hydronephrosis.

Mildly enlarged prostate and findings of chronic bladder outlet
obstruction.

Aortic Atherosclerosis (62A97-ZTF.F) and Emphysema ([id number].Q).
